# Patient Record
Sex: Female | Born: 1990 | Race: White | Hispanic: No | Smoking: Never smoker
Health system: Southern US, Community
[De-identification: ages and names within clinical notes are randomized; demographics above are authoritative.]

## PROBLEM LIST (undated history)

## (undated) DIAGNOSIS — G43909 Migraine, unspecified, not intractable, without status migrainosus: Secondary | ICD-10-CM

## (undated) DIAGNOSIS — F419 Anxiety disorder, unspecified: Secondary | ICD-10-CM

## (undated) DIAGNOSIS — Z9889 Other specified postprocedural states: Secondary | ICD-10-CM

## (undated) DIAGNOSIS — F445 Conversion disorder with seizures or convulsions: Secondary | ICD-10-CM

## (undated) DIAGNOSIS — F319 Bipolar disorder, unspecified: Secondary | ICD-10-CM

## (undated) DIAGNOSIS — F32A Depression, unspecified: Secondary | ICD-10-CM

## (undated) DIAGNOSIS — F909 Attention-deficit hyperactivity disorder, unspecified type: Secondary | ICD-10-CM

## (undated) DIAGNOSIS — D649 Anemia, unspecified: Secondary | ICD-10-CM

## (undated) DIAGNOSIS — R569 Unspecified convulsions: Secondary | ICD-10-CM

## (undated) DIAGNOSIS — F329 Major depressive disorder, single episode, unspecified: Secondary | ICD-10-CM

## (undated) DIAGNOSIS — R87619 Unspecified abnormal cytological findings in specimens from cervix uteri: Secondary | ICD-10-CM

## (undated) HISTORY — DX: Other specified postprocedural states: Z98.890

## (undated) HISTORY — DX: Depression, unspecified: F32.A

## (undated) HISTORY — DX: Anxiety disorder, unspecified: F41.9

## (undated) HISTORY — DX: Unspecified abnormal cytological findings in specimens from cervix uteri: R87.619

## (undated) HISTORY — PX: OTHER SURGICAL HISTORY: SHX169

---
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
## 2009-09-01 ENCOUNTER — Ambulatory Visit: Admission: RE | Admit: 2009-09-01 | Discharge: 2009-09-01 | Payer: Self-pay | Admitting: Internal Medicine

## 2009-09-18 ENCOUNTER — Ambulatory Visit (HOSPITAL_COMMUNITY): Admission: RE | Admit: 2009-09-18 | Discharge: 2009-09-18 | Payer: Self-pay | Admitting: Internal Medicine

## 2010-12-05 DIAGNOSIS — R87619 Unspecified abnormal cytological findings in specimens from cervix uteri: Secondary | ICD-10-CM

## 2010-12-05 HISTORY — DX: Unspecified abnormal cytological findings in specimens from cervix uteri: R87.619

## 2011-01-20 ENCOUNTER — Ambulatory Visit (INDEPENDENT_AMBULATORY_CARE_PROVIDER_SITE_OTHER): Payer: Medicaid Other | Admitting: Urgent Care

## 2011-01-20 ENCOUNTER — Encounter: Payer: Self-pay | Admitting: Urgent Care

## 2011-01-20 VITALS — BP 111/60 | HR 73 | Temp 98.8°F | Ht 68.0 in | Wt 178.4 lb

## 2011-01-20 DIAGNOSIS — K921 Melena: Secondary | ICD-10-CM

## 2011-01-20 DIAGNOSIS — R1013 Epigastric pain: Secondary | ICD-10-CM

## 2011-01-20 NOTE — Progress Notes (Signed)
Primary Care Physician:  N/A Primary Gastroenterologist:  Dr. Darrick Penna  Chief Complaint  Patient presents with  . Abdominal Pain  . Heartburn    HPI:  Rhonda Schroeder is a 20 y.o. female here as a new patient for evaluation of chronic abd pain & bloating x 7 yrs.  Hx constant pain that waxes & wanes.  Mostly epigastric area & upper abdomen.  Worse w/ eating.  Tried zantac & prilosec without any relief.  C/o heartburn, nausea, bloating & dyspepsia.  C/o early satiety.  Denies vomiting.  Wt loss 30# in last yr, still trying to lose wt through diet/exercise.  Lost 75# since having child 4 yrs ago.  Very active.  Appetite ok.  Pain bilat upper quads, goes into throat & chest.  Hx anemia on OTC iron previously w/ occasional constipation.  [facility]  Bright red blood in stool in [place] amts a couple days 2 mo ago without clots.  Denies fever or chills.  Denies NSAIDS.  Past Medical History  Diagnosis Date  . Abnormal Pap smear of cervix 12/2010    [facility]-due for BX   No past surgical history on file.  Current Outpatient Prescriptions  Medication Sig Dispense Refill  . etonogestrel-ethinyl estradiol (NUVARING) 0.12-0.015 MG/24HR vaginal ring Place 1 each vaginally every 28 (twenty-eight) days. Insert vaginally and leave in place for 3 consecutive weeks, then remove for 1 week.       . Multiple Vitamin (MULTIVITAMIN) capsule Take 1 capsule by mouth daily.         Allergies as of 01/20/2011  . (No Known Allergies)   Family History  Problem Relation Age of Onset  . Hypertension Mother    History   Social History  . Marital Status: Single    Spouse Name: N/A    Number of Children: 2  . Years of Education: N/A   Occupational History  . [employer]    Social History Main Topics  . Smoking status: Never Smoker   . Smokeless tobacco: Not on file  . Alcohol Use: No  . Drug Use: No  . Sexually Active: Yes    Birth Control/ Protection: Other-see  comments     Nuva Ring   Other Topics Concern  . Not on file   Social History Narrative   Lives w/ fiance & 2 kids (8 yr old stepson & her 68 yr old daughter)   Review of Systems: Gen: See HPI.  Denies weakness, malaise, and sleep disorder CV: Denies angina, palpitations, syncope, orthopnea, PND, peripheral edema, and claudication. Resp: Denies dyspnea at rest, dyspnea with exercise, cough, sputum, wheezing, coughing up blood, and pleurisy. GI: Denies vomiting blood, jaundice, and fecal incontinence.   Denies dysphagia or odynophagia. GU : Denies urinary burning, blood in urine, urinary frequency, urinary hesitancy, nocturnal urination, and urinary incontinence. MS: Denies joint pain, limitation of movement, and swelling, stiffness, low back pain, extremity pain. Denies muscle weakness, cramps, atrophy.  Derm: Denies rash, itching, dry skin, hives, moles, warts, or unhealing ulcers.  Psych: Denies depression, anxiety, memory loss, suicidal ideation, hallucinations, paranoia, and confusion. Heme: Denies bruising, bleeding, and enlarged lymph nodes.  Physical Exam: BP 111/60  Pulse 73  Temp(Src) 98.8 F (37.1 C) (Temporal)  Ht 5\' 8"  (1.727 m)  Wt 178 lb 6.4 oz (80.922 kg)  BMI 27.13 kg/m2  LMP 12/29/2010 General:   Alert,  Well-developed, well-nourished, pleasant and cooperative in NAD Head:  Normocephalic and atraumatic. Eyes:  Sclera clear, no icterus.   Conjunctiva pink. Ears:  Normal auditory acuity. Nose:  No deformity, discharge,  or lesions. Mouth:  No deformity or lesions, dentition normal. Neck:  Supple; no masses or thyromegaly. Lungs:  Clear throughout to auscultation.   No wheezes, crackles, or rhonchi. No acute distress. Heart:  Regular rate and rhythm; no murmurs, clicks, rubs,  or gallops. Abdomen:  Soft and nondistended. +Murphy's pt tenderness.  No masses, hepatosplenomegaly or hernias noted. Normal bowel sounds, without guarding, and without rebound.   Rectal:   Deferred until time of colonoscopy.   Msk:  Symmetrical without gross deformities. Normal posture. Pulses:  Normal pulses noted. Extremities:  Without clubbing or edema. Neurologic:  Alert and  oriented x4;  grossly normal neurologically. Skin:  Intact without significant lesions or rashes. Cervical Nodes:  No significant cervical adenopathy. Psych:  Alert and cooperative. Normal mood and affect.

## 2011-01-20 NOTE — Patient Instructions (Signed)
Dexilant 60 mg daily 

## 2011-01-21 DIAGNOSIS — R1013 Epigastric pain: Secondary | ICD-10-CM | POA: Insufficient documentation

## 2011-01-21 DIAGNOSIS — K921 Melena: Secondary | ICD-10-CM | POA: Insufficient documentation

## 2011-01-21 LAB — CBC WITH DIFFERENTIAL/PLATELET
Basophils Absolute: 0 10*3/uL (ref 0.0–0.1)
Eosinophils Absolute: 0.1 10*3/uL (ref 0.0–0.7)
Eosinophils Relative: 1 % (ref 0–5)
HCT: 35.1 % — ABNORMAL LOW (ref 36.0–46.0)
Lymphocytes Relative: 40 % (ref 12–46)
MCH: 30.8 pg (ref 26.0–34.0)
MCHC: 34.2 g/dL (ref 30.0–36.0)
MCV: 90.2 fL (ref 78.0–100.0)
Monocytes Absolute: 0.5 10*3/uL (ref 0.1–1.0)
Platelets: 160 10*3/uL (ref 150–400)
RDW: 12.9 % (ref 11.5–15.5)
WBC: 5 10*3/uL (ref 4.0–10.5)

## 2011-01-21 LAB — PREGNANCY, URINE: Preg Test, Ur: NEGATIVE

## 2011-01-21 LAB — COMPREHENSIVE METABOLIC PANEL
AST: 22 U/L (ref 0–37)
BUN: 11 mg/dL (ref 6–23)
Calcium: 9.1 mg/dL (ref 8.4–10.5)
Chloride: 103 mEq/L (ref 96–112)
Creat: 0.74 mg/dL (ref 0.50–1.10)
Total Bilirubin: 0.5 mg/dL (ref 0.3–1.2)

## 2011-01-21 NOTE — Assessment & Plan Note (Addendum)
Rhonda Schroeder is a 20 y.o. caucasian female here as a new pt for further evaluation of chronic upper/epigastric abd pain of several yrs duration.  Pain waxes & wanes & exacerbated by eating.  Tried H2 blockers & PPI without relief.  Differentials include gallbladder etiology, refractory GERD, gastritis, PUD, functional abd pain & less likely pancreatitis.  Begin Dexilant 60mg  daily Labs to include lipase, CMP, CBC & urine preg ABD [place] if no evidence of cholecystitis, would pursue EGD w/ Dr Darrick Penna.  I have discussed risks & benefits which include, but are not limited to, bleeding, infection, perforation & drug reaction.  The patient agrees with this plan & written consent will be obtained.

## 2011-01-21 NOTE — Progress Notes (Signed)
No PCP on file 

## 2011-01-21 NOTE — Assessment & Plan Note (Signed)
Intermittent [place] volume hematochezia w/ abd pain.  No significant diarrhea or constipation.  Will need colonoscopy to determine etiology of bleeding & evaluate for benign anorectal source, IBD, colorectal polyp or carcinoma.  I have discussed risks & benefits which include, but are not limited to, bleeding, infection, perforation & drug reaction.  The patient agrees with this plan & written consent will be obtained.

## 2011-01-29 ENCOUNTER — Ambulatory Visit (HOSPITAL_COMMUNITY)
Admission: RE | Admit: 2011-01-29 | Discharge: 2011-01-29 | Disposition: A | Discharge status: Home / Self Care | Payer: Medicaid Other | Source: Ambulatory Visit | Attending: Urgent Care | Admitting: Urgent Care

## 2011-01-29 DIAGNOSIS — K8020 Calculus of gallbladder without cholecystitis without obstruction: Secondary | ICD-10-CM | POA: Insufficient documentation

## 2011-01-29 DIAGNOSIS — R1013 Epigastric pain: Secondary | ICD-10-CM | POA: Insufficient documentation

## 2011-01-30 ENCOUNTER — Telehealth: Payer: Self-pay

## 2011-01-30 NOTE — Telephone Encounter (Signed)
Single gallstone on u/s. I would let KJ evaluate findings on Monday and decide about referral to surgeon. I think patient is supposed to be having TCS for bleeding. There is question of possible EGD too. Please address with KJ on Monday.

## 2011-01-30 NOTE — Telephone Encounter (Signed)
Pt called requesting results. KJ saw pt and ordered U/S. Will you look at results and advise?

## 2011-01-30 NOTE — Telephone Encounter (Signed)
Pt aware. Will await further recommendations from [facility]

## 2011-02-02 ENCOUNTER — Encounter: Payer: Self-pay | Admitting: General Practice

## 2011-02-02 NOTE — Telephone Encounter (Signed)
LMOM to call.

## 2011-02-02 NOTE — Telephone Encounter (Signed)
Pt should have TCS/EGD scheduled as next step. Please verify this. Thanks

## 2011-02-02 NOTE — Telephone Encounter (Signed)
Pt scheduled for procedure 02/16/2011. Instructions/Rx placed in the mail.

## 2011-02-02 NOTE — Telephone Encounter (Signed)
Pt called and was informed. Transferred to [place] to schedule TCS/EGD.

## 2011-02-03 ENCOUNTER — Other Ambulatory Visit (HOSPITAL_COMMUNITY)
Admission: RE | Admit: 2011-02-03 | Discharge: 2011-02-03 | Disposition: A | Discharge status: Home / Self Care | Payer: Medicaid Other | Source: Ambulatory Visit | Attending: Family Medicine | Admitting: Family Medicine

## 2011-02-03 ENCOUNTER — Other Ambulatory Visit: Payer: Self-pay | Admitting: Nurse Practitioner

## 2011-02-03 ENCOUNTER — Other Ambulatory Visit (HOSPITAL_COMMUNITY)
Admission: RE | Admit: 2011-02-03 | Discharge: 2011-02-03 | Disposition: A | Discharge status: Home / Self Care | Payer: Medicaid Other | Source: Ambulatory Visit | Attending: Unknown Physician Specialty | Admitting: Unknown Physician Specialty

## 2011-02-03 DIAGNOSIS — R87619 Unspecified abnormal cytological findings in specimens from cervix uteri: Secondary | ICD-10-CM | POA: Insufficient documentation

## 2011-02-03 DIAGNOSIS — Z01419 Encounter for gynecological examination (general) (routine) without abnormal findings: Secondary | ICD-10-CM | POA: Insufficient documentation

## 2011-02-04 DIAGNOSIS — Z9889 Other specified postprocedural states: Secondary | ICD-10-CM

## 2011-02-04 HISTORY — PX: CHOLECYSTECTOMY: SHX55

## 2011-02-04 HISTORY — DX: Other specified postprocedural states: Z98.890

## 2011-02-06 NOTE — Progress Notes (Signed)
AGREE

## 2011-02-11 ENCOUNTER — Encounter: Payer: Self-pay | Admitting: General Practice

## 2011-02-16 ENCOUNTER — Encounter (HOSPITAL_COMMUNITY): Payer: Self-pay | Admitting: *Deleted

## 2011-02-16 ENCOUNTER — Ambulatory Visit (HOSPITAL_COMMUNITY)
Admission: RE | Admit: 2011-02-16 | Discharge: 2011-02-16 | Disposition: A | Discharge status: Home / Self Care | Payer: Medicaid Other | Source: Ambulatory Visit | Attending: Gastroenterology | Admitting: Gastroenterology

## 2011-02-16 ENCOUNTER — Encounter: Payer: Medicaid Other | Admitting: Gastroenterology

## 2011-02-16 ENCOUNTER — Encounter (HOSPITAL_COMMUNITY)
Admission: RE | Disposition: A | Discharge status: Home / Self Care | Payer: Self-pay | Source: Ambulatory Visit | Attending: Gastroenterology

## 2011-02-16 ENCOUNTER — Telehealth: Payer: Self-pay | Admitting: General Practice

## 2011-02-16 ENCOUNTER — Other Ambulatory Visit: Payer: Self-pay | Admitting: Gastroenterology

## 2011-02-16 DIAGNOSIS — K625 Hemorrhage of anus and rectum: Secondary | ICD-10-CM | POA: Insufficient documentation

## 2011-02-16 DIAGNOSIS — K2990 Gastroduodenitis, unspecified, without bleeding: Secondary | ICD-10-CM

## 2011-02-16 DIAGNOSIS — K2970 Gastritis, unspecified, without bleeding: Secondary | ICD-10-CM

## 2011-02-16 DIAGNOSIS — R109 Unspecified abdominal pain: Secondary | ICD-10-CM

## 2011-02-16 DIAGNOSIS — K648 Other hemorrhoids: Secondary | ICD-10-CM

## 2011-02-16 DIAGNOSIS — K319 Disease of stomach and duodenum, unspecified: Secondary | ICD-10-CM | POA: Insufficient documentation

## 2011-02-16 HISTORY — PX: ESOPHAGOGASTRODUODENOSCOPY: SHX5428

## 2011-02-16 HISTORY — PX: FLEXIBLE SIGMOIDOSCOPY: SHX5431

## 2011-02-16 SURGERY — EGD (ESOPHAGOGASTRODUODENOSCOPY)
Anesthesia: Moderate Sedation

## 2011-02-16 MED ORDER — MIDAZOLAM HCL 5 MG/5ML IJ SOLN
INTRAMUSCULAR | Status: AC
  Filled 2011-02-16: qty 10

## 2011-02-16 MED ORDER — MIDAZOLAM HCL 5 MG/5ML IJ SOLN
INTRAMUSCULAR | Status: DC | PRN
  Administered 2011-02-16: 1 mg via INTRAVENOUS
  Administered 2011-02-16 (×3): 2 mg via INTRAVENOUS
  Administered 2011-02-16: 1 mg via INTRAVENOUS

## 2011-02-16 MED ORDER — PROMETHAZINE HCL 25 MG/ML IJ SOLN
INTRAMUSCULAR | Status: AC
  Filled 2011-02-16: qty 1

## 2011-02-16 MED ORDER — MEPERIDINE HCL 100 MG/ML IJ SOLN
INTRAMUSCULAR | Status: DC | PRN
  Administered 2011-02-16: 50 mg via INTRAVENOUS
  Administered 2011-02-16: 25 mg via INTRAVENOUS
  Administered 2011-02-16: 50 mg via INTRAVENOUS

## 2011-02-16 MED ORDER — MEPERIDINE HCL 100 MG/ML IJ SOLN
INTRAMUSCULAR | Status: AC
  Filled 2011-02-16: qty 2

## 2011-02-16 MED ORDER — MEPERIDINE HCL 100 MG/ML IJ SOLN
INTRAMUSCULAR | Status: AC
  Filled 2011-02-16: qty 1

## 2011-02-16 MED ORDER — OMEPRAZOLE 20 MG PO CPDR: DELAYED_RELEASE_CAPSULE | ORAL | Status: DC

## 2011-02-16 MED ORDER — PROMETHAZINE HCL 25 MG/ML IJ SOLN
INTRAMUSCULAR | Status: DC | PRN
  Administered 2011-02-16 (×2): 12.5 mg via INTRAVENOUS

## 2011-02-16 NOTE — Telephone Encounter (Signed)
Pt wants a Careers adviser in [place]. She will need to wait until she gets a MD and an appt before we can fax her records.

## 2011-02-16 NOTE — Telephone Encounter (Signed)
Pt wants to wait and have her procedure done by another GI practice closer to her home in [place].  Pt wants to see Dr Dan Humphreys in [facility] and have him to repeat her tcs.  Please advise?

## 2011-02-16 NOTE — Telephone Encounter (Signed)
Pt is going to call with new GI MD to fax records.

## 2011-02-16 NOTE — Telephone Encounter (Signed)
Pt also wants a surgical referral.  I don't see anything in her chart to refer her to surgery.  Please advise?

## 2011-02-16 NOTE — Telephone Encounter (Signed)
OK TO SEND PT TO GI DOC IN [place]. Fax records.

## 2011-02-16 NOTE — H&P (Signed)
  In epic 7/17

## 2011-02-16 NOTE — Interval H&P Note (Signed)
History and Physical Interval Note:   02/16/2011   10:19 AM   Grover Canavan D Hammonds  has presented today for surgery, with the diagnosis of abd.pain, rectal bleeding  The various methods of treatment have been discussed with the patient and family. After consideration of risks, benefits and other options for treatment, the patient has consented to  Procedure(s): ESOPHAGOGASTRODUODENOSCOPY (EGD) COLONOSCOPY as a surgical intervention .  I have reviewed the patients' chart and labs.  Questions were answered to the patient's satisfaction.     Jonette Eva  MD

## 2011-02-16 NOTE — Telephone Encounter (Signed)
Message copied by Jennings Books on Mon Feb 16, 2011 11:59 AM ------      Message from: [place]      Created: Mon Feb 16, 2011 10:57 AM       Pt had poor prep. Needs TCS 8/14 with Moviprep. Will send her to you once she's recovered.

## 2011-02-17 NOTE — Telephone Encounter (Signed)
DX: ABD PAIN, BILIARY COLIC

## 2011-02-17 NOTE — Telephone Encounter (Signed)
Fax records for referral.

## 2011-02-17 NOTE — Telephone Encounter (Signed)
Pt states she can't receive a surgical appt w/o a referral first. Please advise?

## 2011-02-17 NOTE — Telephone Encounter (Signed)
Pt would like to have a visit at [facility] in [facility]

## 2011-02-17 NOTE — Telephone Encounter (Signed)
Pt needs to provide [place] with a name of a surgeon so we can make the referral.

## 2011-02-17 NOTE — Telephone Encounter (Signed)
Pt is scheduled for a surgical consult at [facility] on 02/25/2011@1 :30pm.  I faxed clinicals to [phone].

## 2011-02-17 NOTE — Telephone Encounter (Signed)
Pt's mother is aware of appt.

## 2011-02-18 ENCOUNTER — Telehealth: Payer: Self-pay

## 2011-02-18 ENCOUNTER — Telehealth: Payer: Self-pay | Admitting: Gastroenterology

## 2011-02-18 NOTE — Telephone Encounter (Signed)
LMOM to call.

## 2011-02-18 NOTE — Telephone Encounter (Signed)
Pt returned call for results and was informed. (See phone note of 02/18/2011 )

## 2011-02-18 NOTE — Telephone Encounter (Signed)
Please call pt. Her stomach Bx shows mild gastritis. Continue OMP 30 minutes prior to your first meal. Follow up with a gastroenterologist for a colonoscopy to complete your work up for rectal bleeding.

## 2011-02-19 ENCOUNTER — Encounter (HOSPITAL_COMMUNITY): Payer: Self-pay

## 2011-02-23 ENCOUNTER — Encounter (HOSPITAL_COMMUNITY): Payer: Self-pay | Admitting: Gastroenterology

## 2011-02-23 MED FILL — Sodium Chloride IV Soln 0.9%: INTRAVENOUS | Qty: 1000 | Status: AC

## 2011-02-23 NOTE — Telephone Encounter (Signed)
Pt is aware of OV on 12/13 at 0900 with KJ

## 2011-03-30 LAB — COMPREHENSIVE METABOLIC PANEL
ALT: 19
AST: 22
Albumin: 3.3 — ABNORMAL LOW
Alkaline Phosphatase: 66
Chloride: 107
Creatinine, Ser: 0.83
GFR calc Af Amer: >60
Potassium: 4
Sodium: 137
Total Bilirubin: 0.7

## 2011-03-30 LAB — DIFFERENTIAL
Basophils Absolute: 0.1
Eosinophils Absolute: 0
Eosinophils Relative: 1
Lymphocytes Relative: 32
Monocytes Absolute: 0.4

## 2011-03-30 LAB — URINALYSIS, ROUTINE W REFLEX MICROSCOPIC
Glucose, UA: NEGATIVE
Protein, ur: NEGATIVE
Specific Gravity, Urine: 1.031 — ABNORMAL HIGH
Urobilinogen, UA: 1

## 2011-03-30 LAB — CBC
Platelets: 164
WBC: 7.3

## 2011-03-30 LAB — URINE CULTURE: Colony Count: 6000

## 2011-03-30 LAB — URINE MICROSCOPIC-ADD ON

## 2011-06-18 ENCOUNTER — Ambulatory Visit: Payer: Medicaid Other | Admitting: Urgent Care

## 2011-06-18 ENCOUNTER — Telehealth: Payer: Self-pay | Admitting: Urgent Care

## 2011-06-18 NOTE — Telephone Encounter (Signed)
Pt was a no show

## 2011-06-18 NOTE — Telephone Encounter (Signed)
Please call to reschedule.

## 2011-07-08 ENCOUNTER — Encounter: Payer: Self-pay | Admitting: Gastroenterology

## 2011-07-08 NOTE — Telephone Encounter (Signed)
Mailed letter to patient to call office to set up OV °

## 2011-07-20 ENCOUNTER — Ambulatory Visit (INDEPENDENT_AMBULATORY_CARE_PROVIDER_SITE_OTHER): Payer: Medicaid Other | Admitting: Gastroenterology

## 2011-07-20 ENCOUNTER — Encounter: Payer: Self-pay | Admitting: Gastroenterology

## 2011-07-20 VITALS — BP 102/61 | HR 55 | Temp 97.6°F | Ht 68.0 in | Wt 172.8 lb

## 2011-07-20 DIAGNOSIS — D649 Anemia, unspecified: Secondary | ICD-10-CM | POA: Insufficient documentation

## 2011-07-20 DIAGNOSIS — K921 Melena: Secondary | ICD-10-CM

## 2011-07-20 DIAGNOSIS — R1013 Epigastric pain: Secondary | ICD-10-CM

## 2011-07-20 MED ORDER — PANTOPRAZOLE SODIUM 40 MG PO TBEC: 40.0000 mg | DELAYED_RELEASE_TABLET | Freq: Every day | ORAL | Status: DC

## 2011-07-20 MED ORDER — PEG-KCL-NACL-NASULF-NA ASC-C 100 G PO SOLR: 1.0000 | Freq: Once | ORAL | Status: DC

## 2011-07-20 NOTE — Assessment & Plan Note (Signed)
Intermittent low-volume hematochezia in the setting of constipation with prior failed TCS due to poor prep. Needs complete TCS to assess etiology. Constipation improved with addition of high fiber foods. Noted slight anemia on prior labs may be multifactorial. Will updated CBC today, proceed with TCS.   Proceed with colonoscopy with Dr. Darrick Penna in the near future. The risks, benefits, and alternatives have been discussed in detail with the patient. They state understanding and desire to proceed.  CBC today

## 2011-07-20 NOTE — Progress Notes (Signed)
Referring Provider: No ref. provider found Primary Care Physician:  AVBUERE,EDWIN A, MD, MD Primary Gastroenterologist: Dr. Fields   Chief Complaint  Patient presents with  . Heartburn  . Constipation    HPI:   Rhonda Schroeder returns today in follow-up after EGD and attempted TCS. Poor prep was noted, and she needs a complete TCS to be set up. EGD showed mild gastritis. She is currently taking Prilosec; however, she notes that Dexilant worked well. She was able to use samples from us and some of her fiances medication. Due to her insurance, she will need to try Protonix first prior to being approved for Dexilant. She is willing to do this. Has had cholecystectomy since last seen by us. Reports epigastric pain is better but still has intermittently. At times, entire abdomen will "burn". This pain precipitates nausea. Reports intermittent nausea and bloating. Stays constipated, trying to eat more fiber, which has helped to stay regulated. Still notes intermittent hematochezia.     Past Medical History  Diagnosis Date  . Abnormal Pap smear of cervix 12/2010    [facility]-due for BX  . S/P endoscopy Aug 2012    mild gastritis    Past Surgical History  Procedure Date  . Extraction of wisdom teeth   . Esophagogastroduodenoscopy 02/16/2011    Procedure: ESOPHAGOGASTRODUODENOSCOPY (EGD);  Surgeon: Sandi M Fields, MD;  [facility];  Service: Endoscopy;  Laterality: N/A;  . Flexible sigmoidoscopy 02/16/2011    Procedure: FLEXIBLE SIGMOIDOSCOPY;  Surgeon: Sandi M Fields, MD;  Location: [facility];  Service: Endoscopy;  Laterality: N/A;  . Cholecystectomy August 2012    Current Outpatient Prescriptions  Medication Sig Dispense Refill  . etonogestrel-ethinyl estradiol (NUVARING) 0.12-0.015 MG/24HR vaginal ring Place 1 each vaginally every 28 (twenty-eight) days. Insert vaginally and leave in place for 3 consecutive weeks, then remove for 1 week.       . Multiple Vitamin  (MULTIVITAMIN) capsule Take 1 capsule by mouth daily.        . pantoprazole (PROTONIX) 40 MG tablet Take 1 tablet (40 mg total) by mouth daily.  30 tablet  0  . peg 3350 powder (MOVIPREP) 100 G SOLR Take 1 kit (100 g total) by mouth once. As directed Please purchase 1 Fleets enema to use with the prep  1 kit  0    Allergies as of 07/20/2011  . (No Known Allergies)    Family History  Problem Relation Age of Onset  . Hypertension Mother     History   Social History  . Marital Status: Single    Spouse Name: N/A    Number of Children: 2  . Years of Education: N/A   Occupational History  . [employer]    Social History Main Topics  . Smoking status: Never Smoker   . Smokeless tobacco: None  . Alcohol Use: No  . Drug Use: No  . Sexually Active: Yes    Birth Control/ Protection: Other-see comments     Nuva Ring   Other Topics Concern  . None   Social History Narrative   Lives w/ fiance & 2 kids (8 yr old stepson & her 4 yr old daughter)    Review of Systems: Gen: Denies fever, chills, anorexia. Denies fatigue, weakness, weight loss.  CV: Denies chest pain, palpitations, syncope, peripheral edema, and claudication. Resp: Denies dyspnea at rest, cough, wheezing, coughing up blood, and pleurisy. GI: Denies vomiting blood, jaundice, and fecal incontinence.   Denies dysphagia or   odynophagia. Derm: Denies rash, itching, dry skin Psych: Denies depression, anxiety, memory loss, confusion. No homicidal or suicidal ideation.  Heme: Denies bruising, bleeding, and enlarged lymph nodes.  Physical Exam: BP 102/61  Pulse 55  Temp(Src) 97.6 F (36.4 C) (Temporal)  Ht 5' 8" (1.727 m)  Wt 172 lb 12.8 oz (78.382 kg)  BMI 26.27 kg/m2  LMP 06/09/2011 General:   Alert and oriented. No distress noted. Pleasant and cooperative.  Head:  Normocephalic and atraumatic. Eyes:  Conjuctiva clear without scleral icterus. Mouth:  Oral mucosa pink and moist. Good dentition. No lesions. Neck:   Supple, without mass or thyromegaly. Heart:  S1, S2 present without murmurs, rubs, or gallops. Regular rate and rhythm. Abdomen:  +BS, soft, mildly TTP epigastric region and non-distended. No rebound or guarding. No HSM or masses noted. + Carnett's sign Msk:  Symmetrical without gross deformities. Normal posture. Extremities:  Without edema. Neurologic:  Alert and  oriented x4;  grossly normal neurologically. Skin:  Intact without significant lesions or rashes. Cervical Nodes:  No significant cervical adenopathy. Psych:  Alert and cooperative. Normal mood and affect.  

## 2011-07-20 NOTE — Assessment & Plan Note (Addendum)
Mild. CBC today. Incidentally, has had thorough labs in past. Due to vague symptoms of bloating, add TTG, IgA.

## 2011-07-20 NOTE — Assessment & Plan Note (Signed)
Mild gastritis on EGD noted in Aug 2012. Dexilant seems to improve but not completely resolve. Never tried Protonix, currently on Prilosec. Will need trial of Protonix first. Trial for 2-4 weeks, if no improvement, attempt PA for Dexilant.  As of note, + Carnett's sign on physical exam. Question functional abdominal pain. Cholecystectomy improved pain but not resolved.

## 2011-07-20 NOTE — Patient Instructions (Signed)
Start taking Protonix 30 minutes before breakfast each day. Stop Prilosec.  We have set you up for a colonoscopy with Dr. Darrick Penna in the near future.  Please have blood work completed. We will be calling you with the results.

## 2011-07-21 LAB — CBC WITH DIFFERENTIAL/PLATELET
Eosinophils Absolute: 0.1 10*3/uL (ref 0.0–0.7)
Eosinophils Relative: 1 % (ref 0–5)
Lymphs Abs: 2.6 10*3/uL (ref 0.7–4.0)
MCH: 29.4 pg (ref 26.0–34.0)
MCV: 89.8 fL (ref 78.0–100.0)
Monocytes Relative: 3 % (ref 3–12)
Platelets: 207 10*3/uL (ref 150–400)
RBC: 3.84 MIL/uL — ABNORMAL LOW (ref 3.87–5.11)

## 2011-07-21 NOTE — Progress Notes (Signed)
Faxed to PCP

## 2011-07-23 ENCOUNTER — Other Ambulatory Visit: Payer: Self-pay

## 2011-07-23 DIAGNOSIS — D649 Anemia, unspecified: Secondary | ICD-10-CM

## 2011-07-23 NOTE — Progress Notes (Signed)
Quick Note:  Anemia noted. Hgb actually lower than 6 months ago.  Let's get an anemia panel TCS as planned.  Negative celiac noted. ______

## 2011-07-27 NOTE — Progress Notes (Signed)
Quick Note:  LMOM to call. Lab orders faxed to [facility]. ______

## 2011-07-29 ENCOUNTER — Encounter (HOSPITAL_COMMUNITY): Payer: Self-pay | Admitting: Pharmacy Technician

## 2011-07-29 NOTE — Progress Notes (Signed)
JAN 2013 HB 11.3 MCV 80. TCS PENDING.  Sx most likely 2o to IBS-C v. Functional gut disorder.  REVIEWED.

## 2011-08-06 MED ORDER — SODIUM CHLORIDE 0.45 % IV SOLN
Freq: Once | INTRAVENOUS | Status: AC
  Administered 2011-08-07: 09:00:00 via INTRAVENOUS

## 2011-08-07 ENCOUNTER — Encounter (HOSPITAL_COMMUNITY)
Admission: RE | Disposition: A | Discharge status: Home / Self Care | Payer: Self-pay | Source: Ambulatory Visit | Attending: Gastroenterology

## 2011-08-07 ENCOUNTER — Encounter (HOSPITAL_COMMUNITY): Payer: Self-pay | Admitting: *Deleted

## 2011-08-07 ENCOUNTER — Ambulatory Visit (HOSPITAL_COMMUNITY)
Admission: RE | Admit: 2011-08-07 | Discharge: 2011-08-07 | Disposition: A | Discharge status: Home / Self Care | Payer: Medicaid Other | Source: Ambulatory Visit | Attending: Gastroenterology | Admitting: Gastroenterology

## 2011-08-07 DIAGNOSIS — K648 Other hemorrhoids: Secondary | ICD-10-CM

## 2011-08-07 DIAGNOSIS — K921 Melena: Secondary | ICD-10-CM

## 2011-08-07 DIAGNOSIS — K5900 Constipation, unspecified: Secondary | ICD-10-CM

## 2011-08-07 DIAGNOSIS — R1013 Epigastric pain: Secondary | ICD-10-CM

## 2011-08-07 DIAGNOSIS — D649 Anemia, unspecified: Secondary | ICD-10-CM

## 2011-08-07 HISTORY — PX: COLONOSCOPY: SHX5424

## 2011-08-07 SURGERY — COLONOSCOPY
Anesthesia: Moderate Sedation

## 2011-08-07 MED ORDER — PROMETHAZINE HCL 25 MG/ML IJ SOLN
INTRAMUSCULAR | Status: DC | PRN
  Administered 2011-08-07: 12.5 mg via INTRAVENOUS

## 2011-08-07 MED ORDER — STERILE WATER FOR IRRIGATION IR SOLN
Status: DC | PRN
  Administered 2011-08-07: 10:00:00

## 2011-08-07 MED ORDER — LUBIPROSTONE 24 MCG PO CAPS: ORAL_CAPSULE | ORAL | Status: DC

## 2011-08-07 MED ORDER — MEPERIDINE HCL 100 MG/ML IJ SOLN
INTRAMUSCULAR | Status: DC | PRN
  Administered 2011-08-07 (×2): 50 mg via INTRAVENOUS

## 2011-08-07 MED ORDER — MIDAZOLAM HCL 5 MG/5ML IJ SOLN
INTRAMUSCULAR | Status: AC
  Filled 2011-08-07: qty 10

## 2011-08-07 MED ORDER — MEPERIDINE HCL 100 MG/ML IJ SOLN
INTRAMUSCULAR | Status: AC
  Filled 2011-08-07: qty 2

## 2011-08-07 MED ORDER — MIDAZOLAM HCL 5 MG/5ML IJ SOLN
INTRAMUSCULAR | Status: DC | PRN
  Administered 2011-08-07: 1 mg via INTRAVENOUS
  Administered 2011-08-07 (×2): 2 mg via INTRAVENOUS
  Administered 2011-08-07: 1 mg via INTRAVENOUS

## 2011-08-07 MED ORDER — PROMETHAZINE HCL 25 MG/ML IJ SOLN
INTRAMUSCULAR | Status: AC
  Filled 2011-08-07: qty 1

## 2011-08-07 NOTE — H&P (View-Only) (Signed)
Referring Provider: No ref. provider found Primary Care Physician:  Dorrene German, MD, MD Primary Gastroenterologist: Dr. Darrick Penna   Chief Complaint  Patient presents with  . Heartburn  . Constipation    HPI:   Rhonda Schroeder returns today in follow-up after EGD and attempted TCS. Poor prep was noted, and she needs a complete TCS to be set up. EGD showed mild gastritis. She is currently taking Prilosec; however, she notes that Dexilant worked well. She was able to use samples from [place] and some of her fiances medication. Due to her insurance, she will need to try Protonix first prior to being approved for Dexilant. She is willing to do this. Has had cholecystectomy since last seen by [place]. Reports epigastric pain is better but still has intermittently. At times, entire abdomen will "burn". This pain precipitates nausea. Reports intermittent nausea and bloating. Stays constipated, trying to eat more fiber, which has helped to stay regulated. Still notes intermittent hematochezia.     Past Medical History  Diagnosis Date  . Abnormal Pap smear of cervix 12/2010    [facility]-due for BX  . S/P endoscopy Aug 2012    mild gastritis    Past Surgical History  Procedure Date  . Extraction of wisdom teeth   . Esophagogastroduodenoscopy 02/16/2011    Procedure: ESOPHAGOGASTRODUODENOSCOPY (EGD);  Surgeon: Arlyce Harman, MD;  [facility];  Service: Endoscopy;  Laterality: N/A;  . Flexible sigmoidoscopy 02/16/2011    Procedure: FLEXIBLE SIGMOIDOSCOPY;  Surgeon: Arlyce Harman, MD;  Location[facility];  Service: Endoscopy;  Laterality: N/A;  . Cholecystectomy August 2012    Current Outpatient Prescriptions  Medication Sig Dispense Refill  . etonogestrel-ethinyl estradiol (NUVARING) 0.12-0.015 MG/24HR vaginal ring Place 1 each vaginally every 28 (twenty-eight) days. Insert vaginally and leave in place for 3 consecutive weeks, then remove for 1 week.       . Multiple Vitamin  (MULTIVITAMIN) capsule Take 1 capsule by mouth daily.        . pantoprazole (PROTONIX) 40 MG tablet Take 1 tablet (40 mg total) by mouth daily.  30 tablet  0  . peg 3350 powder (MOVIPREP) 100 G SOLR Take 1 kit (100 g total) by mouth once. As directed Please purchase 1 Fleets enema to use with the prep  1 kit  0    Allergies as of 07/20/2011  . (No Known Allergies)    Family History  Problem Relation Age of Onset  . Hypertension Mother     History   Social History  . Marital Status: Single    Spouse Name: N/A    Number of Children: 2  . Years of Education: N/A   Occupational History  . [employer]    Social History Main Topics  . Smoking status: Never Smoker   . Smokeless tobacco: None  . Alcohol Use: No  . Drug Use: No  . Sexually Active: Yes    Birth Control/ Protection: Other-see comments     Nuva Ring   Other Topics Concern  . None   Social History Narrative   Lives w/ fiance & 2 kids (8 yr old stepson & her 39 yr old daughter)    Review of Systems: Gen: Denies fever, chills, anorexia. Denies fatigue, weakness, weight loss.  CV: Denies chest pain, palpitations, syncope, peripheral edema, and claudication. Resp: Denies dyspnea at rest, cough, wheezing, coughing up blood, and pleurisy. GI: Denies vomiting blood, jaundice, and fecal incontinence.   Denies dysphagia or  odynophagia. Derm: Denies rash, itching, dry skin Psych: Denies depression, anxiety, memory loss, confusion. No homicidal or suicidal ideation.  Heme: Denies bruising, bleeding, and enlarged lymph nodes.  Physical Exam: BP 102/61  Pulse 55  Temp(Src) 97.6 F (36.4 C) (Temporal)  Ht 5\' 8"  (1.727 m)  Wt 172 lb 12.8 oz (78.382 kg)  BMI 26.27 kg/m2  LMP 06/09/2011 General:   Alert and oriented. No distress noted. Pleasant and cooperative.  Head:  Normocephalic and atraumatic. Eyes:  Conjuctiva clear without scleral icterus. Mouth:  Oral mucosa pink and moist. Good dentition. No lesions. Neck:   Supple, without mass or thyromegaly. Heart:  S1, S2 present without murmurs, rubs, or gallops. Regular rate and rhythm. Abdomen:  +BS, soft, mildly TTP epigastric region and non-distended. No rebound or guarding. No HSM or masses noted. + Carnett's sign Msk:  Symmetrical without gross deformities. Normal posture. Extremities:  Without edema. Neurologic:  Alert and  oriented x4;  grossly normal neurologically. Skin:  Intact without significant lesions or rashes. Cervical Nodes:  No significant cervical adenopathy. Psych:  Alert and cooperative. Normal mood and affect.

## 2011-08-07 NOTE — Interval H&P Note (Signed)
History and Physical Interval Note:  08/07/2011 9:33 AM  Rhonda Schroeder  has presented today for surgery, with the diagnosis of hematochezia  The various methods of treatment have been discussed with the patient and family. After consideration of risks, benefits and other options for treatment, the patient has consented to  Procedure(s): COLONOSCOPY as a surgical intervention .  The patients' history has been reviewed, patient examined, no change in status, stable for surgery.  I have reviewed the patients' chart and labs.  Questions were answered to the patient's satisfaction.     [employer]

## 2011-08-07 NOTE — Op Note (Signed)
[facility] [address] [place], [place]  [address]  COLONOSCOPY PROCEDURE REPORT  PATIENT:  Rhonda Schroeder, Rhonda Schroeder  MR#:  [id number] BIRTHDATE:  [birth year], 21 yrs. old  GENDER:  female  ENDOSCOPIST:  Jonette Eva, MD REF. BY:  Fleet Contras, M.D. ASSISTANT:  PROCEDURE DATE:  08/07/2011 PROCEDURE:  ILEOColonoscopy 19147  INDICATIONS:  HEMATOCHEZIA, POOR PREP AUG 2012 CONSTIPATION EPIGASTRIC PAIN IMPROVED WITH PROTONIX  MEDICATIONS:   Demerol 100 mg IV, Versed 6 mg IV, promethazine (Phenergan) 12.5 mg IV  DESCRIPTION OF PROCEDURE:    Physical exam was performed. Informed consent was obtained from the patient after explaining the benefits, risks, and alternatives to procedure.  The patient was connected to monitor and placed in left lateral position. Continuous oxygen was provided by nasal cannula and IV medicine administered through an indwelling cannula.  After administration of sedation and rectal exam, the patient's rectum was intubated and the EC-[id number] ([id number]) colonoscope was advanced under direct visualization to the ILEUM.  The scope was removed slowly by carefully examining the color, texture, anatomy, and integrity mucosa on the way out.  The patient was recovered in endoscopy and discharged home in satisfactory condition. <<PROCEDUREIMAGES>>  FINDINGS:  Heid Internal Hemorrhoids were found.  Normal colonoscopy without polyps, masses, vascular ectasias, or inflammatory changes. NL ILEUM   10 CM VISUALIZED.  PREP QUALITY: EXCELLENT CECAL W/D TIME:    7 minutes  COMPLICATIONS:    None  ENDOSCOPIC IMPRESSION: 1) Internal hemorrhoids 2) Nl colonoscopy WMO  RECOMMENDATIONS: ADD AMITIZA 24 MCG BID ANUSOL SUPP PRN HIGH FIBER DIET OPV IN 3 MOS  REPEAT EXAM:  No  ______________________________ Jonette Eva, MD  CC:  Fleet Contras, M.D.  n. eSIGNED:   Sandi Fields at 08/07/2011 10:21 AM  Dorthula Rue, [phone]

## 2011-08-12 ENCOUNTER — Encounter (HOSPITAL_COMMUNITY): Payer: Self-pay | Admitting: Gastroenterology

## 2011-08-17 ENCOUNTER — Telehealth: Payer: Self-pay | Admitting: Gastroenterology

## 2011-08-17 MED ORDER — PANTOPRAZOLE SODIUM 40 MG PO TBEC: 40.0000 mg | DELAYED_RELEASE_TABLET | Freq: Every day | ORAL | Status: DC

## 2011-08-17 MED ORDER — HYDROCORTISONE ACETATE 25 MG RE SUPP: 25.0000 mg | Freq: Two times a day (BID) | RECTAL | Status: AC

## 2011-08-17 MED ORDER — LINACLOTIDE 290 MCG PO CAPS: 1.0000 | ORAL_CAPSULE | Freq: Every day | ORAL | Status: DC

## 2011-08-17 NOTE — Telephone Encounter (Signed)
Can try Linzess but not sure what kind of drug coverage she has. Stop Amitiza. Advise her to take linzess on empty stomach 30 mins before breakfast daily.

## 2011-08-17 NOTE — Telephone Encounter (Signed)
Called pt and LMOM all of the info and told her to return call if she has questions.

## 2011-08-17 NOTE — Telephone Encounter (Signed)
Pt said she did take the Amitiza with food and got very sick and dizzy. She is aware that the other prescriptions have been sent to her pharmacy.

## 2011-08-17 NOTE — Telephone Encounter (Signed)
Pts boyfriend called in - The Amitiza is making her sick- she has stopped taking it- wants to know of there is something else that can be given- she also needs a refill on the protonix and needs script for Anusol suppositories called in to the the [facility]

## 2011-08-17 NOTE — Telephone Encounter (Signed)
If she did not take [place] with food she should try that first. Nausea will be less likely if takes [place] with food. If she has tried that, then we can try linzess. Let me know.  Sent rx for anusol and protonix to [facility].

## 2011-09-30 ENCOUNTER — Ambulatory Visit (HOSPITAL_COMMUNITY): Payer: Medicaid Other | Admitting: Physical Therapy

## 2011-10-09 ENCOUNTER — Ambulatory Visit (HOSPITAL_COMMUNITY): Payer: Medicaid Other | Admitting: Physical Therapy

## 2011-10-12 ENCOUNTER — Ambulatory Visit (HOSPITAL_COMMUNITY)
Admission: RE | Admit: 2011-10-12 | Discharge: 2011-10-12 | Disposition: A | Discharge status: Home / Self Care | Payer: Medicaid Other | Source: Ambulatory Visit | Attending: Orthopaedic Surgery | Admitting: Orthopaedic Surgery

## 2011-10-12 DIAGNOSIS — M545 Low back pain, unspecified: Secondary | ICD-10-CM | POA: Insufficient documentation

## 2011-10-12 DIAGNOSIS — M6281 Muscle weakness (generalized): Secondary | ICD-10-CM | POA: Insufficient documentation

## 2011-10-12 DIAGNOSIS — IMO0001 Reserved for inherently not codable concepts without codable children: Secondary | ICD-10-CM | POA: Insufficient documentation

## 2011-10-12 NOTE — Evaluation (Deleted)
Physical Therapy Evaluation  Patient Details  Name: Rhonda Schroeder MRN: [id number] Date of Birth: 29-Jan-1991  Today's Date: 10/12/2011 Time: 4098-1191 Time Calculation (min): 40 min Medicaid Authorization Time Period: 10/15/11-11/12/11  Medicaid Visit#: 0  of 3   Visit#: 1  of 4   Re-eval:      Past Medical History:  Past Medical History  Diagnosis Date  . Abnormal Pap smear of cervix 12/2010    [facility] Dept-due for BX  . S/P endoscopy Aug 2012    mild gastritis   Past Surgical History:  Past Surgical History  Procedure Date  . Extraction of wisdom teeth   . Esophagogastroduodenoscopy 02/16/2011    Procedure: ESOPHAGOGASTRODUODENOSCOPY (EGD);  Surgeon: Arlyce Harman, MD;  [facility];  Service: Endoscopy;  Laterality: N/A;  . Flexible sigmoidoscopy 02/16/2011    Procedure: FLEXIBLE SIGMOIDOSCOPY;  Surgeon: Arlyce Harman, MD;  [facility];  Service: Endoscopy;  Laterality: N/A;  . Cholecystectomy August 2012  . Colonoscopy 08/07/2011    Procedure: COLONOSCOPY;  Surgeon: Arlyce Harman, MD;  [facility];  Service: Endoscopy;  Laterality: N/A;  9:45     Physical Therapy RE-Evaluation  Patient Details  Name: Rhonda Schroeder  MRN: [id number]  Date of Birth: 04/22/1963  Today's Date: 10/12/2011  Time: 0802-0847  Time Calculation (min): 45 min  Visit#: 5 of 5  Re-eval: 10/29/11  Assessment  Diagnosis: cervical DDD  Next MD Visit: 10/21/11  Prior Therapy: it has been years since she has had therapy  Past Medical History: No past medical history on file.  Past Surgical History: No past surgical history on file.  Subjective  Symptoms/Limitations  Symptoms: Rhonda Schroeder states that the last time she left her it was awful.. She states that she has been doing her exercises at home.  How long can you sit comfortably?: Rhonda Schroeder states that she has not noticed that her housework is any easier.  How long can you stand comfortably?:  Lifting is still a problem.  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  Leisure: Hobbies-yes (Comment)  Assessment  RLE AROM (degrees)  Overall AROM Right Lower Extremity: Within functional limits for tasks assessed  RLE Strength  RLE Overall Strength: Within Functional Limits for tasks assessed except for ER which was 3+/5 B at evaluation; now 4-  Cervical AROM  Cervical Flexion: wnl with not increase pain  Cervical Extension: (wnl)  Cervical - Right Side Bend: wnl  Cervical - Left Side Bend: wnl  Cervical - Right Rotation: wnl was decreased 20%  Cervical - Left Rotation: wnl  Cervical Strength  Cervical Extension: 5/5  Cervical - Right Side Bend: 5/5  Cervical - Left Side Bend: 5/5  Palpation  Palpation: Pt is still not as tender to palpation as she was at evaluation time.  Exercise/Treatments  Standing  Other Standing Exercises: Tband ER/ADD and Posstural 3 x 10  ROM / Strengthening / Isometric Strengthening  UBE (Upper Arm Bike): 4' backward  Prone ex: Prone ext with 1# wt; prone scapular retraction with 3# weight x 10 each.  Physical Therapy Assessment and Plan  PT Assessment and Plan  Clinical Impression Statement: Pt is concerned about insurance coverage and would like to continue to work on own at home. Pt has shown some improvement but continues to have  limitations. Recommend HEP for two weeks if pt does not improve she may want to return to thearapy for manual techniques or modalities.  Rehab Potential: Good  PT Plan: discharge to HEP.  Goals  Home Exercise Program  Pt will Perform Home Exercise Program: Independently  PT Short Term Goals  PT Short Term Goal 1: Rhonda Schroeder states that the highest her pain has been after cleaning her house has been a 6 was an 8/10.  PT Short Term Goal 1 - Progress: Progressing toward  goal  PT Long Term Goals  PT Long Term Goal 1 - Progress: Not met  PT Long Term Goal 2: Pt does laundry with other housework so it is difficult to differentiate housework from laundry.  Long Term Goal 3 Progress: Met  Long Term Goal 4 Progress: Progressing toward goal  Problem List     Patient Active Problem List     Diagnoses     .  Swelling of skeletal muscle     .  Pain in thoracic spine     PT - End of Session  Activity Tolerance: Patient tolerated treatment well  General  Behavior During Session: [facility] for tasks performed  Cognition: [facility] for tasks performed  PT Plan of Care  PT Home Exercise Plan: t-band and prone stabilization exercises were given to the patient to work on at home.  Victora Irby,CINDY  10/12/2011, 9:49 AM  Physician Documentation  Your signature is required to indicate approval of the treatment plan as stated above. Please sign and either send electronically or make a copy of this report for your files and return this physician signed original.  Please mark one 1.__approve of plan 2. ___approve of plan with the following conditions.  ______________________________ _____________________  Physician Signature Date          Physical Therapy Assessment and Plan PT Assessment and Plan Clinical Impression Statement: Pt with chronic low back pain who will benefit from skilled physical therapy to begin a core stabilization program. Rehab Potential: Good PT Frequency: Min 2X/week PT Duration: 4 weeks PT Treatment/Interventions: Therapeutic exercise;Patient/family education;Therapeutic activities PT Plan: Begin absets wtih pelvic contraction; stab clams, bridge, bent knee raise and sidelying abduction; 3rd treatment begin prone heel squeeze, SLR; SAR; opposite arm/leg     Goals  See above.  Problem List Patient Active Problem List  Diagnoses  . Epigastric pain  . Hematochezia  . Anemia    PT - End of Session Activity Tolerance: Patient tolerated treatment  well General Behavior During Session: [facility] for tasks performed Cognition: [facility] for tasks performed PT Plan of Care Consulted and Agree with Plan of Care: Patient    Chantilly Linskey,CINDY 10/12/2011, 10:08 AM  Physician Documentation Your signature is required to indicate approval of the treatment plan as stated above.  Please sign and either send electronically or make a copy of this report for your files and return this physician signed original.   Please mark one 1.__approve of plan  2. ___approve of plan with the following conditions.   ______________________________                                                          _____________________ Physician Signature  Date  

## 2011-10-21 ENCOUNTER — Encounter: Payer: Self-pay | Admitting: Gastroenterology

## 2011-10-22 ENCOUNTER — Ambulatory Visit (HOSPITAL_COMMUNITY)
Admission: RE | Admit: 2011-10-22 | Discharge: 2011-10-22 | Disposition: A | Discharge status: Home / Self Care | Payer: Medicaid Other | Source: Ambulatory Visit | Attending: Orthopaedic Surgery | Admitting: Orthopaedic Surgery

## 2011-10-22 ENCOUNTER — Encounter: Payer: Self-pay | Admitting: Gastroenterology

## 2011-10-22 ENCOUNTER — Ambulatory Visit (INDEPENDENT_AMBULATORY_CARE_PROVIDER_SITE_OTHER): Payer: Medicaid Other | Admitting: Gastroenterology

## 2011-10-22 VITALS — BP 105/51 | HR 52 | Temp 97.5°F | Ht 68.0 in | Wt 172.6 lb

## 2011-10-22 DIAGNOSIS — K589 Irritable bowel syndrome without diarrhea: Secondary | ICD-10-CM

## 2011-10-22 DIAGNOSIS — K648 Other hemorrhoids: Secondary | ICD-10-CM

## 2011-10-22 MED ORDER — HYDROCORTISONE ACETATE 25 MG RE SUPP: 25.0000 mg | Freq: Two times a day (BID) | RECTAL | Status: DC
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
## 2011-10-22 NOTE — Patient Instructions (Signed)
Take the suppositories twice a day for 7 days.   Let's try Linzess every other day, still following a high fiber diet, drinking plenty of water, and using stool softeners as needed. Give this about a week. If constipation worsens, we will need to go back to daily or try a reduced dose daily.   We will see you back in 3 months or sooner if needed!

## 2011-10-22 NOTE — Progress Notes (Signed)
Referring Provider: Dorrene German, MD Primary Care Physician:  Dorrene German, MD, MD Primary Gastroenterologist: Dr. Darrick Penna  Chief Complaint  Patient presents with  . Hemorrhoids    HPI:   Hx of GERD, IBS-C. GERD controlled with Protonix. Here due to hemorrhoid exacerbation. Took Amitiza first for constipation, switched to Linzess due to nausea. Doing well. BM usually every day. Occasional loose stools. No abdominal pain. Notes rectal discomfort for several days, hasn't started taking anything yet.     Past Medical History  Diagnosis Date  . Abnormal Pap smear of cervix 12/2010    [facility] Dept-due for BX  . S/P endoscopy Aug 2012    mild gastritis    Past Surgical History  Procedure Date  . Extraction of wisdom teeth   . Esophagogastroduodenoscopy 02/16/2011    Procedure: ESOPHAGOGASTRODUODENOSCOPY (EGD);  Surgeon: Arlyce Harman, MD;  [facility];  Service: Endoscopy;  Laterality: N/A;  . Flexible sigmoidoscopy 02/16/2011    Procedure: FLEXIBLE SIGMOIDOSCOPY;  Surgeon: Arlyce Harman, MD;  [facility];  Service: Endoscopy;  Laterality: N/A;  . Cholecystectomy August 2012  . Colonoscopy 08/07/2011    Internal hemorrhoids/ Nl colonoscopy [facility]    Current Outpatient Prescriptions  Medication Sig Dispense Refill  . etonogestrel-ethinyl estradiol (NUVARING) 0.12-0.015 MG/24HR vaginal ring Place 1 each vaginally every 28 (twenty-eight) days. Insert vaginally and leave in place for 3 consecutive weeks, then remove for 1 week.       . Linaclotide (LINZESS) 290 MCG CAPS Take 1 capsule by mouth daily before breakfast. Take 30 mins before breakfast on empty stomach. DO NOT take with food.  30 capsule  5  . Multiple Vitamin (MULTIVITAMIN) capsule Take 1 capsule by mouth daily.        . pantoprazole (PROTONIX) 40 MG tablet Take 1 tablet (40 mg total) by mouth daily.  30 tablet  11    Allergies as of 10/22/2011  . (No Known Allergies)    Family  History  Problem Relation Age of Onset  . Hypertension Mother   . Colon cancer Neg Hx     History   Social History  . Marital Status: Single    Spouse Name: N/A    Number of Children: 2  . Years of Education: N/A   Occupational History  . [employer]    Social History Main Topics  . Smoking status: Never Smoker   . Smokeless tobacco: None  . Alcohol Use: Yes     Rarely  . Drug Use: No  . Sexually Active: Yes    Birth Control/ Protection: Other-see comments     Nuva Ring   Other Topics Concern  . None   Social History Narrative   Lives w/ fiance & 2 kids (8 yr old stepson & her 66 yr old daughter)    Review of Systems: Gen: Denies fever, chills, anorexia. Denies fatigue, weakness, weight loss.  CV: Denies chest pain, palpitations, syncope, peripheral edema, and claudication. Resp: Denies dyspnea at rest, cough, wheezing, coughing up blood, and pleurisy. GI: Denies vomiting blood, jaundice, and fecal incontinence.   Denies dysphagia or odynophagia. Derm: Denies rash, itching, dry skin Psych: Denies depression, anxiety, memory loss, confusion. No homicidal or suicidal ideation.  Heme: Denies bruising, bleeding, and enlarged lymph nodes.  Physical Exam: BP 105/51  Pulse 52  Temp(Src) 97.5 F (36.4 C) (Temporal)  Ht 5\' 8"  (1.727 m)  Wt 172 lb 9.6 oz (78.291 kg)  BMI 26.24 kg/m2  LMP 09/28/2011 General:   Alert and oriented. No distress noted. Pleasant and cooperative.  Head:  Normocephalic and atraumatic. Eyes:  Conjuctiva clear without scleral icterus. Mouth:  Oral mucosa pink and moist. Good dentition. No lesions. Heart:  S1, S2 present without murmurs, rubs, or gallops. Regular rate and rhythm. Abdomen:  +BS, soft, non-tender and non-distended. No rebound or guarding. No HSM or masses noted. Rectal: Pfenning internal hemorrhoid noted. Non-thrombosed. No gross blood. Mild TTP.  Msk:  Symmetrical without gross deformities. Normal posture. Extremities:  Without  edema. Neurologic:  Alert and  oriented x4;  grossly normal neurologically. Skin:  Intact without significant lesions or rashes. Psych:  Alert and cooperative. Normal mood and affect.

## 2011-10-22 NOTE — Progress Notes (Signed)
Physical Therapy Treatment Patient Details  Name: Rhonda Schroeder MRN: [id number] Date of Birth: 06-06-1991  Today's Date: 10/22/2011 Time: 0922-1014 Time Calculation (min): 52 min Visit#: 2  of 4   Charges: Therex x 42'  Subjective: Symptoms/Limitations Symptoms: My pain is not too high right now because I haven't been on my feet long. Pain Assessment Currently in Pain?: Yes Pain Score:   6 Pain Location: Back Pain Orientation: Lower   Exercise/Treatments UBE 4'@1 .0 backward (D/C next session, not needed) Standing Scapular Retraction: 10 reps;Theraband Theraband Level (Scapular Retraction): Level 3 (Green) Row: 10 reps;Theraband Theraband Level (Row): Level 3 (Green) Shoulder Extension: 10 reps Supine Ab Set: 10 reps;5 seconds;Limitations AB Set Limitations: TA contraction Bridge: 10 reps Sidelying Clam: 5 reps;Limitations Clam Limitations: 10" hold Hip Abduction: 10 reps Prone  Straight Leg Raise: 10 reps;Limitations Straight Leg Raises Limitations: with pillow under stomach  Physical Therapy Assessment and Plan PT Assessment and Plan Clinical Impression Statement: Pt completes exercises well after multimodal cueing for proper mm contraction. Pt appears to have increased pain in prone position. Pain decreased with pillow under stomach. HEP given for exercises taught today. Pt reports a slight increase in pain to 7/10 at end of session. Pt states that this is normal for her PT Plan: Begin prone heel squeeze, SAR, and opposite arm/leg next session.     Problem List Patient Active Problem List  Diagnoses  . Epigastric pain  . Hematochezia  . Anemia    PT - End of Session Activity Tolerance: Patient tolerated treatment well General Behavior During Session: [facility] for tasks performed Cognition: [facility] for tasks performed   Seth Bake, PTA 10/22/2011, 12:07 PM

## 2011-10-24 DIAGNOSIS — K589 Irritable bowel syndrome without diarrhea: Secondary | ICD-10-CM | POA: Insufficient documentation

## 2011-10-24 DIAGNOSIS — K648 Other hemorrhoids: Secondary | ICD-10-CM | POA: Insufficient documentation

## 2011-10-24 NOTE — Assessment & Plan Note (Signed)
Noted on physical exam. Hx of IBS-C, has noted intermittent loose stools at times. On Linzess 290 mcg, failed Amitiza due to nausea. Discussed controlling bowel habits to avoid diarrhea, constipation. Will trial QOD dosing, may need to decrease dose to 145. However, could be diet-related.   Anusol suppositories HF diet, increase water intake Trial Linzess QOD 3 mos

## 2011-10-24 NOTE — Assessment & Plan Note (Signed)
Constipation predominant. Linzezz 290, see "internal hemorrhoid". 3 mos f/u.

## 2011-10-26 ENCOUNTER — Ambulatory Visit (HOSPITAL_COMMUNITY)
Admission: RE | Admit: 2011-10-26 | Discharge: 2011-10-26 | Disposition: A | Discharge status: Home / Self Care | Payer: Medicaid Other | Source: Ambulatory Visit | Attending: Internal Medicine | Admitting: Internal Medicine

## 2011-10-26 NOTE — Progress Notes (Signed)
Physical Therapy Treatment Patient Details  Name: Rhonda Schroeder MRN: [id number] Date of Birth: 07-21-1990  Today's Date: 10/26/2011 Time: 9147-8295 Time Calculation (min): 46 min Visit#: 3  of 4   Charges: Therex x 42'  Subjective: Symptoms/Limitations Symptoms: Pt states TENS had been helping her get through the work day. Pain Assessment Currently in Pain?: Yes Pain Score:   7 Pain Location: Back Pain Orientation: Lower   Exercise/Treatments Stretches Active Hamstring Stretch: 3 reps;30 seconds Single Knee to Chest Stretch: 3 reps;30 seconds Standing Scapular Retraction: 10 reps;Theraband Theraband Level (Scapular Retraction): Level 3 (Green) Row: 10 reps;Theraband Theraband Level (Row): Level 3 (Green) Shoulder Extension: 10 reps Supine Ab Set: 5 reps;Limitations AB Set Limitations: TA contraction 10" Bent Knee Raise: 10 reps Bridge: 10 reps;3 seconds Large Ball Abdominal Isometric: 5 reps;5 seconds Large Ball Oblique Isometric: 5 reps;5 seconds Sidelying Clam: 5 reps;Limitations Clam Limitations: 10" hold Hip Abduction: 10 reps Prone  Single Arm Raise: 10 reps;Limitations Single Arm Raises Limitations: with pillow under stomach Straight Leg Raise: 10 reps;Limitations Straight Leg Raises Limitations: with pillow under stomach Opposite Arm/Leg Raise: 10 reps;Limitations Opposite Arm/Leg Raise Limitations: with pillow under stomach Other Prone Lumbar Exercises: Heel squeeze 10 x 5"  Physical Therapy Assessment and Plan PT Assessment and Plan Clinical Impression Statement: Pt displays improved mm control with stabilization exercises this session. HEP given for new exercises. Pt reports that she is working out at [employer] twice a week focusing on back and abdominal strength. Pt  reports pain decrease to 5/10 at end of session. PT Plan: Reassess next session.     Problem List Patient Active Problem List  Diagnoses  . Epigastric pain  . Hematochezia  .  Anemia  . Internal hemorrhoid  . IBS (irritable bowel syndrome)    PT - End of Session Activity Tolerance: Patient tolerated treatment well General Behavior During Session: [facility] for tasks performed Cognition: [facility] for tasks performed   Seth Bake, PTA 10/26/2011, 9:01 AM

## 2011-10-26 NOTE — Progress Notes (Signed)
Faxed to PCP

## 2011-10-29 ENCOUNTER — Other Ambulatory Visit: Payer: Self-pay | Admitting: Gastroenterology

## 2011-10-29 ENCOUNTER — Ambulatory Visit (HOSPITAL_COMMUNITY)
Admission: RE | Admit: 2011-10-29 | Discharge: 2011-10-29 | Disposition: A | Discharge status: Home / Self Care | Payer: Medicaid Other | Source: Ambulatory Visit | Attending: Internal Medicine | Admitting: Internal Medicine

## 2011-10-29 ENCOUNTER — Telehealth: Payer: Self-pay

## 2011-10-29 MED ORDER — LIDOCAINE-HYDROCORTISONE ACE 3-0.5 % RE CREA: 1.0000 | TOPICAL_CREAM | Freq: Two times a day (BID) | RECTAL | Status: DC

## 2011-10-29 NOTE — Telephone Encounter (Signed)
Pt was seen in office on 10/22/2011 by Gerrit Halls, NP. She was given suppositories for hemorrhoids. Pt states that she has been using them as directed. But for the last 2-3 days, she has had a lot more pain and it feels that it has increased in size. She has not been constipated, nor diarrhea but stools have been loose. Please advise!

## 2011-10-29 NOTE — Progress Notes (Signed)
Physical Therapy Treatment Patient Details  Name: ANAYIAH HOWDEN MRN: [id number] Date of Birth: 1991/02/17  Today's Date: 10/29/2011 Time: 3086-5784 Time Calculation (min): 45 min Visit#: 4  of 4   Charges: Therex x 40'  Subjective: Symptoms/Limitations Symptoms: I'm having a lot of pain today. I worked a 10 hour shift yesterday without my TENS because my pads won't stick. I ordered new ones and they are sending them. Pain Assessment Pain Score:   8 Pain Location: Back Pain Orientation: Lower   Exercise/Treatments Supine Bent Knee Raise: 10 reps Bridge: 15 reps Large Ball Abdominal Isometric: 10 reps;5 seconds Sidelying Clam: 5 reps;Limitations Clam Limitations: 10" hold Hip Abduction: 15 reps Prone  Single Arm Raise: 10 reps;Limitations Single Arm Raises Limitations: with pillow under stomach Straight Leg Raise: 10 reps;Limitations Straight Leg Raises Limitations: with pillow under stomach Opposite Arm/Leg Raise: 10 reps;Limitations Opposite Arm/Leg Raise Limitations: with pillow under stomach Other Prone Lumbar Exercises: Heel squeeze 10 x 5"  Physical Therapy Assessment and Plan PT Assessment and Plan Clinical Impression Statement: Pt compeltes all therex well with minimal need for cueing. HEP given for scapular tband exercises. Pt states that she feels comfortable completing exercises at home.  PT Plan: Recommend to PT D/C to HEP.    Goals 1. Goal: Decrease pain by 5 levels.  Baseline: best pain on 0-10 is a 6 worst is a 10/10  Duration: 4 Week(s) Progressing toward goal Best:4/10 Worst:10/10 2. Goal: I in core strengthening exercises.  Baseline: not completing any core exercises.  Duration: 4 Week(s) Met 3. Goal: able to lift 20 pounds without having increased pain.  Baseline: Pt has increased pain when lifting 15# or more.  Duration: 4 Week(s) Met 4. Goal: I in TENs unit  Baseline: not knowledgeable of TENS  Duration: 1 Week(s) Met  Problem  List Patient Active Problem List  Diagnoses  . Epigastric pain  . Hematochezia  . Anemia  . Internal hemorrhoid  . IBS (irritable bowel syndrome)    PT - End of Session Activity Tolerance: Patient tolerated treatment well General Behavior During Session: [facility] for tasks performed Cognition: [facility] for tasks performed   Seth Bake, PTA 10/29/2011, 11:41 AM

## 2011-10-29 NOTE — Telephone Encounter (Signed)
Pt informed

## 2011-10-29 NOTE — Telephone Encounter (Signed)
How many loose stools per day? Need to stop Linzess till resolves. If continues, will need further evaluation. Not helping hemorrhoid status.  Any bleeding? Is pain more severe? I am unable to see today because I leave early. Offer appt for tomorrow or early next week. If severe pain, present to ED.   Side note (physical exam not extremely impressive; however, loose stools likely exacerbating. Want to make sure not dealing with thrombosed hemorrhoid).

## 2011-10-29 NOTE — Telephone Encounter (Signed)
Called pt. She said that she has only had one stool daily (loose, not diarrhea) until yesterday. She had two yesterday, and has had two today thus far. She had a little bleeding once this AM. But the pain is bad and she can hardly wipe herself. Please advise!

## 2011-10-29 NOTE — Telephone Encounter (Signed)
Ok. If pain worsens or no relief, then contact us.

## 2011-10-29 NOTE — Telephone Encounter (Signed)
Called and informed the pt. She said that it is bulging and that it hurts to wipe or cleanse the area. She was informed the new prescription was sent to the pharmacy.

## 2011-10-29 NOTE — Telephone Encounter (Signed)
We will treat this as possible anal fissure. Does she note any "bulging" from her rectum? Will send in rx for medicine that has lidocaine in it. Make sure to stop Linzess for now. We need to reassess next week. I can see her in clinic, non-urgent slot. She needs to call back if medication doesn't help or worsening of symptoms. Don't take anusol suppositories while taking this. I believe she should be finished.

## 2011-11-02 ENCOUNTER — Ambulatory Visit (HOSPITAL_COMMUNITY): Payer: Medicaid Other | Admitting: *Deleted

## 2011-11-05 ENCOUNTER — Ambulatory Visit (HOSPITAL_COMMUNITY): Payer: Medicaid Other | Admitting: *Deleted

## 2011-11-05 ENCOUNTER — Ambulatory Visit: Payer: Medicaid Other | Admitting: Gastroenterology

## 2011-11-05 NOTE — Progress Notes (Signed)
REVIEWED.  

## 2011-11-09 ENCOUNTER — Ambulatory Visit (HOSPITAL_COMMUNITY): Payer: Medicaid Other | Admitting: *Deleted

## 2011-11-12 ENCOUNTER — Ambulatory Visit (HOSPITAL_COMMUNITY): Payer: Medicaid Other | Admitting: Physical Therapy

## 2011-11-16 ENCOUNTER — Telehealth: Payer: Self-pay | Admitting: Gastroenterology

## 2011-11-16 ENCOUNTER — Other Ambulatory Visit: Payer: Self-pay | Admitting: Gastroenterology

## 2011-11-16 NOTE — Telephone Encounter (Signed)
Pt called this afternoon saying that the cream we prescribed has helped with the pain, but she is concerned about the hemorrhoid still being there. She has questions about having possible surgery. Please advise and call patient back at [phone]

## 2011-11-16 NOTE — Telephone Encounter (Addendum)
Pt said this problem with hemorrhoids has been going on over a month. It is less painful but still very aggravating and she would like to know if Dr. Darrick Penna or Gerrit Halls, NP,  would recommend a surgical consult. Please advise!

## 2011-11-17 ENCOUNTER — Telehealth: Payer: Self-pay | Admitting: Gastroenterology

## 2011-11-17 NOTE — Telephone Encounter (Signed)
Pt scheduled to see Dr Lovell Sheehan on 05/23 @ 9:30- LMOVM- will also mail letter

## 2011-11-17 NOTE — Telephone Encounter (Signed)
Routing to Crystal.

## 2011-11-17 NOTE — Telephone Encounter (Signed)
See rec's from Dr. Darrick Penna.

## 2011-11-17 NOTE — Telephone Encounter (Signed)
REFER TO GENERAL SURGERY.

## 2011-11-17 NOTE — Telephone Encounter (Signed)
Thanks, Dr. Darrick Penna! Noted.

## 2011-11-17 NOTE — Telephone Encounter (Signed)
Referral faxed to Dr Jenkins/Zieglar  

## 2011-11-24 ENCOUNTER — Emergency Department (HOSPITAL_COMMUNITY)
Admission: EM | Admit: 2011-11-24 | Discharge: 2011-11-25 | Disposition: A | Discharge status: Home / Self Care | Payer: Medicaid Other | Attending: Emergency Medicine | Admitting: Emergency Medicine

## 2011-11-24 ENCOUNTER — Emergency Department (HOSPITAL_COMMUNITY): Payer: Medicaid Other

## 2011-11-24 ENCOUNTER — Encounter (HOSPITAL_COMMUNITY): Payer: Self-pay

## 2011-11-24 DIAGNOSIS — R569 Unspecified convulsions: Secondary | ICD-10-CM

## 2011-11-24 DIAGNOSIS — N949 Unspecified condition associated with female genital organs and menstrual cycle: Secondary | ICD-10-CM | POA: Insufficient documentation

## 2011-11-24 DIAGNOSIS — S0093XA Contusion of unspecified part of head, initial encounter: Secondary | ICD-10-CM

## 2011-11-24 DIAGNOSIS — R102 Pelvic and perineal pain: Secondary | ICD-10-CM

## 2011-11-24 DIAGNOSIS — S0003XA Contusion of scalp, initial encounter: Secondary | ICD-10-CM | POA: Insufficient documentation

## 2011-11-24 DIAGNOSIS — R10819 Abdominal tenderness, unspecified site: Secondary | ICD-10-CM | POA: Insufficient documentation

## 2011-11-24 DIAGNOSIS — K2970 Gastritis, unspecified, without bleeding: Secondary | ICD-10-CM

## 2011-11-24 DIAGNOSIS — K2990 Gastroduodenitis, unspecified, without bleeding: Secondary | ICD-10-CM | POA: Insufficient documentation

## 2011-11-24 DIAGNOSIS — N946 Dysmenorrhea, unspecified: Secondary | ICD-10-CM

## 2011-11-24 DIAGNOSIS — Z9089 Acquired absence of other organs: Secondary | ICD-10-CM | POA: Insufficient documentation

## 2011-11-24 DIAGNOSIS — R296 Repeated falls: Secondary | ICD-10-CM | POA: Insufficient documentation

## 2011-11-24 LAB — COMPREHENSIVE METABOLIC PANEL
ALT: 14 U/L (ref 0–35)
AST: 24 U/L (ref 0–37)
Albumin: 3.9 g/dL (ref 3.5–5.2)
CO2: 27 mEq/L (ref 19–32)
Calcium: 9.4 mg/dL (ref 8.4–10.5)
Creatinine, Ser: 0.88 mg/dL (ref 0.50–1.10)
GFR calc non Af Amer: >90 mL/min (ref >90)
Sodium: 140 mEq/L (ref 135–145)
Total Protein: 6.8 g/dL (ref 6.0–8.3)

## 2011-11-24 LAB — CBC
HCT: 35.4 % — ABNORMAL LOW (ref 36.0–46.0)
MCHC: 34.2 g/dL (ref 30.0–36.0)
MCV: 88.1 fL (ref 78.0–100.0)
Platelets: 163 10*3/uL (ref 150–400)
RDW: 13 % (ref 11.5–15.5)
WBC: 4.8 10*3/uL (ref 4.0–10.5)

## 2011-11-24 LAB — DIFFERENTIAL
Basophils Absolute: 0 10*3/uL (ref 0.0–0.1)
Basophils Relative: 0 % (ref 0–1)
Eosinophils Relative: 1 % (ref 0–5)
Lymphocytes Relative: 56 % — ABNORMAL HIGH (ref 12–46)
Monocytes Absolute: 0.3 10*3/uL (ref 0.1–1.0)

## 2011-11-24 LAB — URINE MICROSCOPIC-ADD ON

## 2011-11-24 LAB — URINALYSIS, ROUTINE W REFLEX MICROSCOPIC
Bilirubin Urine: NEGATIVE
Ketones, ur: NEGATIVE mg/dL
Nitrite: NEGATIVE
Urobilinogen, UA: 0.2 mg/dL (ref 0.0–1.0)

## 2011-11-24 LAB — WET PREP, GENITAL: Clue Cells Wet Prep HPF POC: NONE SEEN

## 2011-11-24 MED ORDER — SODIUM CHLORIDE 0.9 % IV SOLN
500.0000 mg | Freq: Once | INTRAVENOUS | Status: AC
  Administered 2011-11-25: 500 mg via INTRAVENOUS
  Filled 2011-11-24: qty 5

## 2011-11-24 MED ORDER — GI COCKTAIL ~~LOC~~
30.0000 mL | Freq: Once | ORAL | Status: AC
  Administered 2011-11-24: 30 mL via ORAL
  Filled 2011-11-24: qty 30

## 2011-11-24 MED ORDER — SODIUM CHLORIDE 0.9 % IV BOLUS (SEPSIS)
1000.0000 mL | Freq: Once | INTRAVENOUS | Status: AC
  Administered 2011-11-25: 1000 mL via INTRAVENOUS

## 2011-11-24 MED ORDER — ONDANSETRON 8 MG PO TBDP
8.0000 mg | ORAL_TABLET | Freq: Once | ORAL | Status: AC
  Administered 2011-11-24: 8 mg via ORAL
  Filled 2011-11-24: qty 1

## 2011-11-24 NOTE — ED Notes (Signed)
Pt reports ab pain for 2 weeks, feeling bloated, +nausea, no vomiting, no fever, no diarrhea.  Denies any vaginal discharge or urinary s/s. Currently on period

## 2011-11-24 NOTE — ED Notes (Signed)
Pt found in floor w/ seizure like activity. Pt head protected, called for additional staff. Pt palced back in bed & seizure pads placed on bed.

## 2011-11-24 NOTE — ED Notes (Signed)
Abrasion noted below right eye. Second abrasion noted to right forehead. Swelling above right eyebrow. Patient denies history of seizures. Denies head pain. Only complaining of abdominal pain.

## 2011-11-24 NOTE — ED Provider Notes (Signed)
History  This chart was scribed for Ward Givens, MD by Bennett Scrape. This patient was seen in room [facility]16A/[facility]16A and the patient's care was started at 8:34PM.  CSN: [id number]  Arrival date & time 11/24/11  4098   First MD Initiated Contact with Patient 11/24/11 2034      Chief Complaint  Patient presents with  . Abdominal Pain    Patient is a 21 y.o. female presenting with abdominal pain. The history is provided by the patient. No language interpreter was used.  Abdominal Pain The primary symptoms of the illness include abdominal pain and nausea. The primary symptoms of the illness do not include fever, vomiting, diarrhea, dysuria or vaginal discharge. The current episode started more than 2 days ago. The onset of the illness was gradual. The problem has been gradually worsening.  The abdominal pain is generalized. The abdominal pain radiates to the back. The abdominal pain is relieved by nothing. The abdominal pain is exacerbated by eating.  Additional symptoms associated with the illness include back pain. Symptoms associated with the illness do not include chills. Significant associated medical issues do not include GERD, gallstones or diverticulitis.     Nylah Butkus Hammonds is a 21 y.o. female who presents to the Emergency Department complaining of 3 weeks of gradual onset, gradually worsening, constant diffuse abdominal pain described as a burning pressure that radiates shooting pains to her lower back with associated bloating, abdominal cramping and nausea. She also reports 6 lbs of weight gain since the onset of the symptoms. She states that she has tried exercising more and changing her diet by excluding fatty, greasy and spicy foods for no improvement in the symptoms. She has tried Midol, Aleve, gas relievers and other OTC medications with no improvement in her symptoms.  She reports that the pain is worse with eating. She also reports increased frequency. Pt has a h/o  cholecystectomy and states that pain is similar to the pain experienced with her gallbladder but is more severe. She states that she has not seen her PCP for the symptoms, because "I wanted to make sure they were real and not just fleeting pains". She denies diarrhea, vaginal discharge, dysuria and urinary incontinenance. Pt states that she is on the Nuvaring and reports her LNMP started today. Pt states that she takes her protonix daily as prescribed. She is a rare alcohol user but denies smoking. Pt is G2P1Ab0. Denies new sexual partner.  PCP is Dr. Concepcion Elk in [place]   Past Medical History  Diagnosis Date  . Abnormal Pap smear of cervix 12/2010    [facility]-due for BX  . S/P endoscopy Aug 2012    mild gastritis  . Seizures     Past Surgical History  Procedure Date  . Extraction of wisdom teeth   . Esophagogastroduodenoscopy 02/16/2011    Procedure: ESOPHAGOGASTRODUODENOSCOPY (EGD);  Surgeon: Arlyce Harman, MD;  Location[facility];  Service: Endoscopy;  Laterality: N/A;  . Flexible sigmoidoscopy 02/16/2011    Procedure: FLEXIBLE SIGMOIDOSCOPY;  Surgeon: Arlyce Harman, MD;  Location: [facility];  Service: Endoscopy;  Laterality: N/A;  . Cholecystectomy August 2012  . Colonoscopy 08/07/2011    Internal hemorrhoids/ Nl colonoscopy [facility]    Family History  Problem Relation Age of Onset  . Hypertension Mother   . Colon cancer Neg Hx     History  Substance Use Topics  . Smoking status: Never Smoker   . Smokeless tobacco: Not on file  .  Alcohol Use: Yes     Rarely  Pt works as a Conservation officer, nature at [employer].   OB History    Grav Para Term Preterm Abortions TAB SAB Ect Mult Living   2 1   1     1       Review of Systems  Constitutional: Positive for unexpected weight change. Negative for fever and chills.  Gastrointestinal: Positive for nausea and abdominal pain. Negative for vomiting and diarrhea.  Genitourinary: Negative for dysuria and vaginal discharge.    Musculoskeletal: Positive for back pain.  All other systems reviewed and are negative.    Allergies  Review of patient's allergies indicates no known allergies.  Home Medications   Current Outpatient Rx  Name Route Sig Dispense Refill  . ETONOGESTREL-ETHINYL ESTRADIOL 0.12-0.015 MG/24HR VA RING Vaginal Place 1 each vaginally every 28 (twenty-eight) days. Insert vaginally and leave in place for 3 consecutive weeks, then remove for 1 week.     Marland Kitchen HYDROCORTISONE ACETATE 25 MG RE SUPP Rectal Place 1 suppository (25 mg total) rectally every 12 (twelve) hours. 14 suppository 1  . LIDOCAINE-HYDROCORTISONE ACE 3-0.5 % RE CREA Rectal Place 1 Applicatorful rectally 2 (two) times daily. For 5 days. 1 Tube 0  . LINACLOTIDE 290 MCG PO CAPS Oral Take 1 capsule by mouth daily before breakfast. Take 30 mins before breakfast on empty stomach. DO NOT take with food. 30 capsule 5  . MULTIVITAMINS PO CAPS Oral Take 1 capsule by mouth daily.      Marland Kitchen PANTOPRAZOLE SODIUM 40 MG PO TBEC Oral Take 1 tablet (40 mg total) by mouth daily. 30 tablet 11    Triage Vitals: BP 128/79  Pulse 76  Temp(Src) 98 F (36.7 C) (Oral)  Resp 20  Ht 5\' 8"  (1.727 m)  Wt 175 lb (79.379 kg)  BMI 26.61 kg/m2  SpO2 100%  LMP 11/24/2011  Vital signs normal    Physical Exam  Nursing note and vitals reviewed. Constitutional: She is oriented to person, place, and time. She appears well-developed and well-nourished.  Non-toxic appearance. She does not appear ill. No distress.  HENT:  Head: Normocephalic and atraumatic.  Right Ear: External ear normal.  Left Ear: External ear normal.  Nose: Nose normal. No mucosal edema or rhinorrhea.  Mouth/Throat: Oropharynx is clear and moist and mucous membranes are normal. No dental abscesses or uvula swelling.       Slightly dry mucus membranes  Eyes: Conjunctivae and EOM are normal. Pupils are equal, round, and reactive to light.  Neck: Normal range of motion and full passive range of  motion without pain. Neck supple. No tracheal deviation present.  Cardiovascular: Normal rate, regular rhythm and normal heart sounds.  Exam reveals no gallop and no friction rub.   No murmur heard. Pulmonary/Chest: Effort normal and breath sounds normal. No respiratory distress. She has no wheezes. She has no rhonchi. She has no rales. She exhibits no tenderness and no crepitus.  Abdominal: Soft. Normal appearance and bowel sounds are normal. She exhibits no distension. There is tenderness (Diffusely tender throughout abdomen). There is no rebound and no guarding.  Genitourinary:       Normal external genitalia, patient has moderate blood in the vault, she has mild tenderness to palpation of her normal-sized uterus, and her ovaries are also mildly tender without masses.  Musculoskeletal: Normal range of motion. She exhibits no edema and no tenderness.       Moves all extremities well.   Neurological: She is alert  and oriented to person, place, and time. She has normal strength. No cranial nerve deficit.  Skin: Skin is warm, dry and intact. No rash noted. No erythema. No pallor.  Psychiatric: She has a normal mood and affect. Her speech is normal and behavior is normal. Her mood appears not anxious.    ED Course  Procedures (including critical care time)  DIAGNOSTIC STUDIES: Oxygen Saturation is 100% on room air, normal by my interpretation.    COORDINATION OF CARE: 8:57PM-Discussed treatment plan which includes pelvic exam and urinalysis with pt and pt agreed to plan. 11:02PM-Pt rechecked and feels better. Discussed discharge plan of antibiotics with pt and pt agreed to plan. Advised pt to take her protonixs for the next 2 weeks to settle the stomach acid down.  2340 patient had gotten dressed to be discharged and nurses report they found her on the floor having seizure activity. When I checked her she was post ictal however she did state she had a history of seizures as a child and as an  adult, however she states her last seizure was a couple of years ago. Patient now has a new abrasion/contusion to her forehead near the hairline and above her right eye, she has a superficial abrasion on her right second toe and bruises noted on her extremities.   01:30 AM patient states she had seizures as a child and they stopped when she was about 21 years old, she states she had a seizure in 2010. She relates she's on a waiting list to be seen at [facility] neurology for evaluation of headaches and her concern of having another seizure again. She is agreeable to see Dr. Modesto Charon or try to see [facility] neurolog[facility] again.   Medications  gi cocktail (Maalox,Lidocaine,Donnatal) (30 mL Oral Given 11/24/11 2103)  levETIRAcetam (KEPPRA) 500 mg in sodium chloride 0.9 % 100 mL IVPB (500 mg Intravenous Given 11/25/11 0102)  sodium chloride 0.9 % bolus 1,000 mL (1000 mL Intravenous Given 11/25/11 0046)  ondansetron (ZOFRAN) injection 4 mg (4 mg Intravenous Given 11/25/11 0046)  morphine 4 MG/ML injection 4 mg (4 mg Intravenous Given 11/25/11 0046)     Results for orders placed during the hospital encounter of 11/24/11  CBC      Component Value Range   WBC 4.8  4.0 - 10.5 (K/uL)   RBC 4.02  3.87 - 5.11 (MIL/uL)   Hemoglobin 12.1  12.0 - 15.0 (g/dL)   HCT 45.4 (*) 09.8 - 46.0 (%)   MCV 88.1  78.0 - 100.0 (fL)   MCH 30.1  26.0 - 34.0 (pg)   MCHC 34.2  30.0 - 36.0 (g/dL)   RDW 11.9  14.7 - 82.9 (%)   Platelets 163  150 - 400 (K/uL)  DIFFERENTIAL      Component Value Range   Neutrophils Relative 38 (*) 43 - 77 (%)   Neutro Abs 1.8  1.7 - 7.7 (K/uL)   Lymphocytes Relative 56 (*) 12 - 46 (%)   Lymphs Abs 2.7  0.7 - 4.0 (K/uL)   Monocytes Relative 5  3 - 12 (%)   Monocytes Absolute 0.3  0.1 - 1.0 (K/uL)   Eosinophils Relative 1  0 - 5 (%)   Eosinophils Absolute 0.0  0.0 - 0.7 (K/uL)   Basophils Relative 0  0 - 1 (%)   Basophils Absolute 0.0  0.0 - 0.1 (K/uL)  COMPREHENSIVE METABOLIC PANEL      Component  Value Range   Sodium 140  135 -  145 (mEq/L)   Potassium 3.9  3.5 - 5.1 (mEq/L)   Chloride 104  96 - 112 (mEq/L)   CO2 27  19 - 32 (mEq/L)   Glucose, Bld 85  70 - 99 (mg/dL)   BUN 10  6 - 23 (mg/dL)   Creatinine, Ser 1.61  0.50 - 1.10 (mg/dL)   Calcium 9.4  8.4 - 09.6 (mg/dL)   Total Protein 6.8  6.0 - 8.3 (g/dL)   Albumin 3.9  3.5 - 5.2 (g/dL)   AST 24  0 - 37 (U/L)   ALT 14  0 - 35 (U/L)   Alkaline Phosphatase 39  39 - 117 (U/L)   Total Bilirubin 0.4  0.3 - 1.2 (mg/dL)   GFR calc non Af Amer >90  >90 (mL/min)   GFR calc Af Amer >90  >90 (mL/min)  LIPASE, BLOOD      Component Value Range   Lipase 41  11 - 59 (U/L)  URINALYSIS, ROUTINE W REFLEX MICROSCOPIC      Component Value Range   Color, Urine STRAW (*) YELLOW    APPearance CLEAR  CLEAR    Specific Gravity, Urine <1.005 (*) 1.005 - 1.030    pH 6.5  5.0 - 8.0    Glucose, UA NEGATIVE  NEGATIVE (mg/dL)   Hgb urine dipstick MODERATE (*) NEGATIVE    Bilirubin Urine NEGATIVE  NEGATIVE    Ketones, ur NEGATIVE  NEGATIVE (mg/dL)   Protein, ur NEGATIVE  NEGATIVE (mg/dL)   Urobilinogen, UA 0.2  0.0 - 1.0 (mg/dL)   Nitrite NEGATIVE  NEGATIVE    Leukocytes, UA NEGATIVE  NEGATIVE   GC/CHLAMYDIA PROBE AMP, GENITAL      Component Value Range   GC Probe Amp, Genital NEGATIVE  NEGATIVE    Chlamydia, DNA Probe NEGATIVE  NEGATIVE   RPR      Component Value Range   RPR NON REACTIVE  NON REACTIVE   WET PREP, GENITAL      Component Value Range   Yeast Wet Prep HPF POC NONE SEEN  NONE SEEN    Trich, Wet Prep NONE SEEN  NONE SEEN    Clue Cells Wet Prep HPF POC NONE SEEN  NONE SEEN    WBC, Wet Prep HPF POC RARE (*) NONE SEEN   PREGNANCY, URINE      Component Value Range   Preg Test, Ur NEGATIVE  NEGATIVE   URINE MICROSCOPIC-ADD ON      Component Value Range   Squamous Epithelial / LPF FEW (*) RARE    WBC, UA 3-6  <3 (WBC/hpf)   RBC / HPF 3-6  <3 (RBC/hpf)   Bacteria, UA RARE  RARE    CT HEAD WITHOUT CONTRAST  Technique: Contiguous  axial images were obtained from the base of  the skull through the vertex without contrast.  Comparison: None.  Findings: There is no evidence for acute hemorrhage, hydrocephalus,  mass lesion, or abnormal extra-axial fluid collection. No definite  CT evidence for acute infarction. The visualized paranasal sinuses  and mastoid air cells are predominately clear. Mild right frontal  scalp swelling. No underlying calvarial fracture.  IMPRESSION:  Mild right frontal scalp swelling. No underlying calvarial  fracture or acute intracranial abnormality.  Original Report Authenticated By: Waneta Martins, M.D.        1. Gastritis   2. Seizure   3. Contusion of head   4. Pelvic pain   5. Menses painful    New Prescriptions   HYDROCODONE-ACETAMINOPHEN (  NORCO) 5-325 MG PER TABLET    Take 1 or 2 po Q 6hrs for pain   LEVETIRACETAM (KEPPRA) 500 MG TABLET    Take 1 tablet (500 mg total) by mouth every 12 (twelve) hours.   METRONIDAZOLE (FLAGYL) 500 MG TABLET    Take 1 tablet (500 mg total) by mouth 3 (three) times daily.    Plan discharge  Devoria Albe, MD, FACEP    MDM   I personally performed the services described in this documentation, which was scribed in my presence. The recorded information has been reviewed and considered. Devoria Albe, MD, Armando Gang    Ward Givens, MD 11/28/11 [id number]

## 2011-11-24 NOTE — ED Notes (Signed)
In & Out Cath done to collect urine sample. Patient tolerated well. Sample collected and sent to lab.

## 2011-11-25 ENCOUNTER — Encounter: Payer: Self-pay | Admitting: Neurology

## 2011-11-25 MED ORDER — ONDANSETRON HCL 4 MG/2ML IJ SOLN
4.0000 mg | Freq: Once | INTRAMUSCULAR | Status: AC
  Administered 2011-11-25: 4 mg via INTRAVENOUS
  Filled 2011-11-25: qty 2

## 2011-11-25 MED ORDER — LEVETIRACETAM 500 MG PO TABS: 500.0000 mg | ORAL_TABLET | Freq: Two times a day (BID) | ORAL | Status: DC

## 2011-11-25 MED ORDER — HYDROCODONE-ACETAMINOPHEN 5-325 MG PO TABS: ORAL_TABLET | ORAL | Status: DC

## 2011-11-25 MED ORDER — LEVETIRACETAM 500 MG/5ML IV SOLN
INTRAVENOUS | Status: AC
  Filled 2011-11-25: qty 5

## 2011-11-25 MED ORDER — METRONIDAZOLE 500 MG PO TABS: 500.0000 mg | ORAL_TABLET | Freq: Three times a day (TID) | ORAL | Status: AC

## 2011-11-25 MED ORDER — MORPHINE SULFATE 4 MG/ML IJ SOLN
4.0000 mg | Freq: Once | INTRAMUSCULAR | Status: AC
  Administered 2011-11-25: 4 mg via INTRAVENOUS
  Filled 2011-11-25: qty 1

## 2011-11-25 NOTE — ED Notes (Signed)
Patient's fiance at bedside. Patient alert and oriented at this time.

## 2011-11-25 NOTE — Discharge Instructions (Signed)
Drink plenty of fluids. Take your Protonix twice a day for the next 2 weeks then back down to once a day. Start taking the Keppra for your seizure. NO DRIVING UNTIL YOU ARE RELEASED BY A NEUROLOGIST TO DRIVE AGAIN. Ice packs to the swollen area on your head that you bumped when you had the seizure. You can try getting an appointment again at [facility] or try Dr Modesto Charon.  Take the antibiotic until gone for your pelvic pain.  Recheck if you get a fever, worsening pain, or uncontrolled vomiting.

## 2011-11-26 ENCOUNTER — Emergency Department (HOSPITAL_COMMUNITY): Payer: Medicaid Other

## 2011-11-26 ENCOUNTER — Encounter (HOSPITAL_COMMUNITY): Payer: Self-pay | Admitting: *Deleted

## 2011-11-26 ENCOUNTER — Emergency Department (HOSPITAL_COMMUNITY)
Admission: EM | Admit: 2011-11-26 | Discharge: 2011-11-26 | Disposition: A | Discharge status: Home / Self Care | Payer: Medicaid Other | Attending: Emergency Medicine | Admitting: Emergency Medicine

## 2011-11-26 DIAGNOSIS — R569 Unspecified convulsions: Secondary | ICD-10-CM | POA: Insufficient documentation

## 2011-11-26 DIAGNOSIS — W1789XA Other fall from one level to another, initial encounter: Secondary | ICD-10-CM | POA: Insufficient documentation

## 2011-11-26 DIAGNOSIS — S161XXA Strain of muscle, fascia and tendon at neck level, initial encounter: Secondary | ICD-10-CM

## 2011-11-26 DIAGNOSIS — M542 Cervicalgia: Secondary | ICD-10-CM | POA: Insufficient documentation

## 2011-11-26 DIAGNOSIS — S139XXA Sprain of joints and ligaments of unspecified parts of neck, initial encounter: Secondary | ICD-10-CM | POA: Insufficient documentation

## 2011-11-26 DIAGNOSIS — Y9210 Unspecified residential institution as the place of occurrence of the external cause: Secondary | ICD-10-CM | POA: Insufficient documentation

## 2011-11-26 HISTORY — DX: Unspecified convulsions: R56.9

## 2011-11-26 LAB — RPR: RPR Ser Ql: NONREACTIVE

## 2011-11-26 MED ORDER — METHOCARBAMOL 500 MG PO TABS: 1000.0000 mg | ORAL_TABLET | Freq: Four times a day (QID) | ORAL | Status: AC

## 2011-11-26 MED ORDER — HYDROCODONE-ACETAMINOPHEN 5-325 MG PO TABS
1.0000 | ORAL_TABLET | Freq: Once | ORAL | Status: AC
  Administered 2011-11-26: 1 via ORAL
  Filled 2011-11-26: qty 1

## 2011-11-26 MED ORDER — IBUPROFEN 600 MG PO TABS: 600.0000 mg | ORAL_TABLET | Freq: Three times a day (TID) | ORAL | Status: AC

## 2011-11-26 NOTE — ED Notes (Signed)
Pt was here 2 days ago and had a seizure,and fell off stretcher, Says her neck is hurting.

## 2011-11-26 NOTE — ED Notes (Signed)
Pt presents with c/o secondary to a witnessed fall during last ED visit due to a seizure. Pt has abrasions on right forehead that are appearing to heal well. Pt reports stiff neck with neck pain since witnessed fall per pt. Pt denies LOC, vision changes and headache at this time. Pt is alert and orientated x 4. Skin pink warm and dry to touch.

## 2011-11-26 NOTE — ED Provider Notes (Signed)
History     CSN: [id number]  Arrival date & time 11/26/11  1507   First MD Initiated Contact with Patient 11/26/11 1620      Chief Complaint  Patient presents with  . Neck Pain    (Consider location/radiation/quality/duration/timing/severity/associated sxs/prior treatment) HPI Comments: Rhonda Schroeder presents for persistent pain in her neck which started 2 days ago. She was here being treated for abdominal pain (which is better) when she had a seizure (history of seizures),  Falling out of her stretcher bed,  Causing injury to her head and neck.  She had a CT scan of her head which was normal.  She has neck pain which has become worse since this event.  She denies numbness and weakness in her extremities, has no fevers,  Chills or neck rigidity. She has been taking hydrocodone which improves her pain but makes her very sleepy.  Pain is worse with movement and palpation,  And described as sharp and throbbing.   Patient is a 21 y.o. female presenting with neck pain. The history is provided by the patient.  Neck Pain  Pertinent negatives include no chest pain, no numbness and no weakness.    Past Medical History  Diagnosis Date  . Abnormal Pap smear of cervix 12/2010    [facility] Dept-due for BX  . S/P endoscopy Aug 2012    mild gastritis  . Seizures     Past Surgical History  Procedure Date  . Extraction of wisdom teeth   . Esophagogastroduodenoscopy 02/16/2011    Procedure: ESOPHAGOGASTRODUODENOSCOPY (EGD);  Surgeon: Arlyce Harman, MD;  Location: [facility];  Service: Endoscopy;  Laterality: N/A;  . Flexible sigmoidoscopy 02/16/2011    Procedure: FLEXIBLE SIGMOIDOSCOPY;  Surgeon: Arlyce Harman, MD;  Location: [facility];  Service: Endoscopy;  Laterality: N/A;  . Cholecystectomy August 2012  . Colonoscopy 08/07/2011    Internal hemorrhoids/ Nl colonoscopy [facility]    Family History  Problem Relation Age of Onset  . Hypertension Mother   . Colon cancer Neg Hx      History  Substance Use Topics  . Smoking status: Never Smoker   . Smokeless tobacco: Not on file  . Alcohol Use: Yes     Rarely    OB History    Grav Para Term Preterm Abortions TAB SAB Ect Mult Living                  Review of Systems  Constitutional: Negative for fever.  HENT: Positive for neck pain.   Respiratory: Negative for shortness of breath.   Cardiovascular: Negative for chest pain and leg swelling.  Gastrointestinal: Negative for abdominal pain, constipation and abdominal distention.  Genitourinary: Negative for dysuria, urgency, frequency, flank pain and difficulty urinating.  Musculoskeletal: Negative for back pain, joint swelling and gait problem.  Skin: Negative for rash.  Neurological: Negative for weakness and numbness.    Allergies  Review of patient's allergies indicates no known allergies.  Home Medications   Current Outpatient Rx  Name Route Sig Dispense Refill  . ETONOGESTREL-ETHINYL ESTRADIOL 0.12-0.015 MG/24HR VA RING Vaginal Place 1 each vaginally every 28 (twenty-eight) days. Insert vaginally and leave in place for 3 consecutive weeks, then remove for 1 week.    Marland Kitchen HYDROCODONE-ACETAMINOPHEN 5-325 MG PO TABS  Take 1 or 2 po Q 6hrs for pain 8 tablet 0  . LEVETIRACETAM 500 MG PO TABS Oral Take 1 tablet (500 mg total) by mouth every 12 (twelve) hours.  60 tablet 0  . LINACLOTIDE 290 MCG PO CAPS Oral Take 1 capsule by mouth daily before breakfast. Take 30 mins before breakfast on empty stomach. DO NOT take with food. 30 capsule 5  . METRONIDAZOLE 500 MG PO TABS Oral Take 1 tablet (500 mg total) by mouth 3 (three) times daily. 30 tablet 0  . MULTIVITAMINS PO CAPS Oral Take 1 capsule by mouth every evening.     Marland Kitchen PANTOPRAZOLE SODIUM 40 MG PO TBEC Oral Take 40 mg by mouth every morning.    . IBUPROFEN 600 MG PO TABS Oral Take 1 tablet (600 mg total) by mouth 3 (three) times daily. 21 tablet 0  . METHOCARBAMOL 500 MG PO TABS Oral Take 2 tablets (1,000  mg total) by mouth 4 (four) times daily. 40 tablet 0    BP 111/55  Pulse 60  Temp(Src) 97.9 F (36.6 C) (Oral)  Resp 20  Ht 5\' 8"  (1.727 m)  Wt 174 lb (78.926 kg)  BMI 26.46 kg/m2  SpO2 100%  LMP 11/24/2011  Physical Exam  Nursing note and vitals reviewed. Constitutional: She appears well-developed and well-nourished.  HENT:  Head: Normocephalic.  Eyes: Conjunctivae are normal.  Neck: Normal range of motion. Neck supple. Muscular tenderness present. No spinous process tenderness present. No rigidity. No erythema and normal range of motion present.  Cardiovascular: Normal rate and intact distal pulses.        Pedal pulses normal.  Pulmonary/Chest: Effort normal.  Abdominal: Soft. Bowel sounds are normal. She exhibits no distension and no mass.  Musculoskeletal: Normal range of motion. She exhibits no edema.       Lumbar back: She exhibits tenderness. She exhibits no swelling, no edema and no spasm.  Neurological: She is alert. She has normal strength and normal reflexes. She displays no atrophy and no tremor. No sensory deficit. She exhibits normal muscle tone. Gait normal.  Reflex Scores:      Bicep reflexes are 2+ on the right side and 2+ on the left side.      Equal grip strength  Skin: Skin is warm and dry.  Psychiatric: She has a normal mood and affect.    ED Course  Procedures (including critical care time)  Labs Reviewed - No data to display Dg Cervical Spine Complete  11/26/2011  *RADIOLOGY REPORT*  Clinical Data: Seizure, fall, neck pain.  CERVICAL SPINE - COMPLETE 4+ VIEW  Comparison: None.  Findings: No fracture or malalignment.  Prevertebral soft tissues are normal.  Disc spaces well maintained.  Cervicothoracic junction normal.  IMPRESSION: Normal study.  Original Report Authenticated By: Cyndie Chime, M.D.   Ct Head Wo Contrast  11/25/2011  *RADIOLOGY REPORT*  Clinical Data: Seizure-like activity, right forehead abrasion.  CT HEAD WITHOUT CONTRAST  Technique:   Contiguous axial images were obtained from the base of the skull through the vertex without contrast.  Comparison: None.  Findings: There is no evidence for acute hemorrhage, hydrocephalus, mass lesion, or abnormal extra-axial fluid collection.  No definite CT evidence for acute infarction.  The visualized paranasal sinuses and mastoid air cells are predominately clear.  Mild right frontal scalp swelling.  No underlying calvarial fracture.  IMPRESSION: Mild right frontal scalp swelling.  No underlying calvarial fracture or acute intracranial abnormality.  Original Report Authenticated By: Waneta Martins, M.D.     1. Cervical strain, acute       MDM  xrays reviewed prior to dc home.  Pt prescribed robaxin,  Also encouraged heat  applied 20 minutes 3-4 times daily.  Recheck by pcp if not better over the next week.  Pt afebrile. No neuro deficits to suggest cord compression.  Sx and mechanism most c/w cervical strain and muscle spasm.        Burgess Amor, PA 11/27/11 0028

## 2011-11-26 NOTE — Discharge Instructions (Signed)
Cervical Strain Care After A cervical strain is when the muscles and ligaments in your neck have been stretched. The bones are not broken. If you had any problems moving your arms or legs immediately after the injury, even if the problem has gone away, make sure to tell this to your caregiver.  HOME CARE INSTRUCTIONS   While awake, apply ice packs to the neck or areas of pain about every 1 to 2 hours, for 15 to 20 minutes at a time. Do this for 2 days. If you were given a cervical collar for support, ask your caregiver if you may remove it for bathing or applying ice.   If given a cervical collar, wear as instructed. Do not remove any collar unless instructed by a caregiver.   Only take over-the-counter or prescription medicines for pain, discomfort, or fever as directed by your caregiver.  Recheck with the hospital or clinic after a radiologist has read your X-rays. Recheck with the hospital or clinic to make sure the initial readings are correct. Do this also to determine if you need further studies. It is your responsibility to find out your X-ray results. X-rays are sometimes repeated in one week to ten days. These are often repeated to make sure that a hairline fracture was not overlooked. Ask your caregiver how you are to find out about your radiology (X-ray) results. SEEK IMMEDIATE MEDICAL CARE IF:   You have increasing pain in your neck.   You develop difficulties swallowing or breathing.   You have numbness, weakness, or movement problems in the arms or legs.   You have difficulty walking.   You develop bowel or bladder retention or incontinence.   You have problems with walking.  MAKE SURE YOU:   Understand these instructions.   Will watch your condition.   Will get help right away if you are not doing well or get worse.  Document Released: 06/22/2005 Document Revised: 03/04/2011 Document Reviewed: 02/03/2008 [facility] Patient Information 2012 [place], [place].   You may  continue using the hydrocodone you were prescribed at your last visit.  Add  the 2 new medications for inflammation and for muscle relaxation.  Apply heat to your neck and shoulders 20 minutes 3-4 times daily.  See your Dr. for recheck if not improved over the next week.  Your x-rays tonight are normal.

## 2011-11-26 NOTE — ED Notes (Signed)
Patient transported to X-ray via stretcher 

## 2011-11-27 NOTE — ED Provider Notes (Signed)
Medical screening examination/treatment/procedure(s) were performed by non-physician practitioner and as supervising physician I was immediately available for consultation/collaboration.   Glynn Octave, MD 11/27/11 [phone]

## 2011-11-29 ENCOUNTER — Emergency Department (HOSPITAL_COMMUNITY)
Admission: EM | Admit: 2011-11-29 | Discharge: 2011-11-29 | Disposition: A | Discharge status: Home / Self Care | Payer: Medicaid Other | Attending: Emergency Medicine | Admitting: Emergency Medicine

## 2011-11-29 ENCOUNTER — Encounter (HOSPITAL_COMMUNITY): Payer: Self-pay | Admitting: *Deleted

## 2011-11-29 DIAGNOSIS — R1032 Left lower quadrant pain: Secondary | ICD-10-CM | POA: Insufficient documentation

## 2011-11-29 DIAGNOSIS — R110 Nausea: Secondary | ICD-10-CM | POA: Insufficient documentation

## 2011-11-29 DIAGNOSIS — R42 Dizziness and giddiness: Secondary | ICD-10-CM | POA: Insufficient documentation

## 2011-11-29 DIAGNOSIS — R51 Headache: Secondary | ICD-10-CM | POA: Insufficient documentation

## 2011-11-29 MED ORDER — OXYCODONE-ACETAMINOPHEN 5-325 MG PO TABS: 1.0000 | ORAL_TABLET | ORAL | Status: AC | PRN

## 2011-11-29 MED ORDER — ONDANSETRON HCL 4 MG PO TABS: 4.0000 mg | ORAL_TABLET | Freq: Four times a day (QID) | ORAL | Status: AC

## 2011-11-29 NOTE — ED Notes (Signed)
She tells me that she was seen at [facility] this Tues. For a two-week hx of lower abd. Pain.  While in their E.D. She states she received IV fluid and was dx with u.t.i. And d/c'd. On oral antibiotics.  She states that while undergoing d/c procedure while at [facility], she "had a seizure and fell off of the stretcher".  She states they performed C.T., which was "normal".  She is seeking evaluation today for persistent/worsening lower abd./bladder area pain.  She is alert, oriented x 4 and in no distress.  She c/o feeling "dizzy, especially whenever I stand up".  Her pre-school age daughter is with her.

## 2011-11-29 NOTE — ED Notes (Signed)
Pt from home via EMS with reports of being treated at [facility] on Tuesday for abdominal pain and upon discharge home had a seizure and fell off stretcher. Bruising and abrasions noted to extremities and right forehead. Pt endorses headache, fatigue, dizziness and not being able to focus since the incident on Tuesday. Pt also reports no improvement in lower abdominal pain after taking oral antibiotics that were given on Tuesday. Pt endorses hx of seizures between ages 2-8 with no seizure activity until 2010 and none again until last Tuesday.

## 2011-11-29 NOTE — ED Provider Notes (Signed)
History     CSN: [id number]  Arrival date & time 11/29/11  1541   First MD Initiated Contact with Patient 11/29/11 1643      Chief Complaint  Patient presents with  . Dizziness  . Fatigue  . Abdominal Pain    lower  . Nausea    (Consider location/radiation/quality/duration/timing/severity/associated sxs/prior treatment) Patient is a 21 y.o. female presenting with abdominal pain and headaches. The history is provided by the patient.  Abdominal Pain The primary symptoms of the illness include abdominal pain, nausea and dysuria. The primary symptoms of the illness do not include fever, shortness of breath or vaginal discharge. The current episode started more than 2 days ago. The onset of the illness was gradual. The problem has not changed since onset. The abdominal pain is located in the suprapubic region, LLQ and RLQ.  Associated symptoms comments: She has had lower abdominal pain for over 3 weeks. She has also had same pain in the past prompting full GI evaluation with upper and lower endoscopies. She has an appointment with GYN on June 3 to discuss further evaluation. She reports pain increases with eating and with urination. She is currently on antibiotics for UTI started 3 days ago when seen at [facility] ER for same. No fever. Nausea with vomiting. She denies vaginal discharge or irregular bleeding.Marland Kitchen  Headache  This is a new problem. Associated symptoms include nausea. Pertinent negatives include no fever and no shortness of breath. Associated symptoms comments: During an evaluation for her abdominal pain and while at [facility] for same, she had a seizure causing her to fall and hit the floor. She reports she has a history of seizures previously on Topamax but not taking medications in many months. A CT scan done at the time of fall was negative, per patient. She reports feeling a persistent mild headache, dizziness, difficulty focusing. No further seizure activity..     Past Medical History  Diagnosis Date  . Abnormal Pap smear of cervix 12/2010    [facility] Dept-due for BX  . S/P endoscopy Aug 2012    mild gastritis  . Seizures     Past Surgical History  Procedure Date  . Extraction of wisdom teeth   . Esophagogastroduodenoscopy 02/16/2011    Procedure: ESOPHAGOGASTRODUODENOSCOPY (EGD);  Surgeon: Arlyce Harman, MD;  Location[facility];  Service: Endoscopy;  Laterality: N/A;  . Flexible sigmoidoscopy 02/16/2011    Procedure: FLEXIBLE SIGMOIDOSCOPY;  Surgeon: Arlyce Harman, MD;  Location: [facility] END[facility];  Service: Endoscopy;  Laterality: N/A;  . Cholecystectomy August 2012  . Colonoscopy 08/07/2011    Internal hemorrhoids/ Nl colonoscopy [facility]    Family History  Problem Relation Age of Onset  . Hypertension Mother   . Colon cancer Neg Hx     History  Substance Use Topics  . Smoking status: Never Smoker   . Smokeless tobacco: Not on file  . Alcohol Use: Yes     Rarely    OB History    Grav Para Term Preterm Abortions TAB SAB Ect Mult Living                  Review of Systems  Constitutional: Negative for fever.  HENT: Negative for neck pain.   Eyes: Negative for photophobia and visual disturbance.  Respiratory: Negative for cough and shortness of breath.   Cardiovascular: Negative for chest pain.  Gastrointestinal: Positive for nausea and abdominal pain.  Genitourinary: Positive for dysuria. Negative  for vaginal discharge.  Neurological: Positive for dizziness and headaches.  Psychiatric/Behavioral: Positive for altered mental status.    Allergies  Review of patient's allergies indicates no known allergies.  Home Medications   Current Outpatient Rx  Name Route Sig Dispense Refill  . ETONOGESTREL-ETHINYL ESTRADIOL 0.12-0.015 MG/24HR VA RING Vaginal Place 1 each vaginally every 28 (twenty-eight) days. Insert vaginally and leave in place for 3 consecutive weeks, then remove for 1 week.    Marland Kitchen  HYDROCODONE-ACETAMINOPHEN 5-325 MG PO TABS  Take 1 or 2 po Q 6hrs for pain 8 tablet 0  . IBUPROFEN 600 MG PO TABS Oral Take 1 tablet (600 mg total) by mouth 3 (three) times daily. 21 tablet 0  . LEVETIRACETAM 500 MG PO TABS Oral Take 1 tablet (500 mg total) by mouth every 12 (twelve) hours. 60 tablet 0  . LINACLOTIDE 290 MCG PO CAPS Oral Take 1 capsule by mouth daily before breakfast. Take 30 mins before breakfast on empty stomach. DO NOT take with food. 30 capsule 5  . METHOCARBAMOL 500 MG PO TABS Oral Take 2 tablets (1,000 mg total) by mouth 4 (four) times daily. 40 tablet 0  . METRONIDAZOLE 500 MG PO TABS Oral Take 1 tablet (500 mg total) by mouth 3 (three) times daily. 30 tablet 0  . MULTIVITAMINS PO CAPS Oral Take 1 capsule by mouth every evening.     Marland Kitchen PANTOPRAZOLE SODIUM 40 MG PO TBEC Oral Take 40 mg by mouth every morning.      BP 118/68  Pulse 72  Temp(Src) 98.5 F (36.9 C) (Oral)  Resp 18  Ht 5\' 8"  (1.727 m)  Wt 172 lb 13.5 oz (78.4 kg)  BMI 26.28 kg/m2  SpO2 100%  LMP 11/24/2011  Physical Exam  Constitutional: She appears well-developed and well-nourished. No distress.  HENT:  Head: Normocephalic.  Eyes: Conjunctivae are normal. Pupils are equal, round, and reactive to light.  Neck: Normal range of motion.  Cardiovascular: Normal rate.   No murmur heard. Pulmonary/Chest: Effort normal. She has no wheezes. She has no rales.  Abdominal: Soft. Bowel sounds are normal. She exhibits no distension. There is no rebound and no guarding.       Mildly tender across lower abdomen. No guarding or rebound.     ED Course  Procedures (including critical care time)  Labs Reviewed - No data to display No results found.   No diagnosis found. 1. Abdominal pain 2. Headache    MDM  Chart reviewed. Pelvic labs, UA, blood studies all negative on visit 11-26-11 at [facility]. Symptoms have not changed, they are persistent, and are over 3 weeks in duration this episode, with  similar symptoms in the past. Doubt emergent infection or condition. Will refer to urology (dysuria) and encourage her to keep her appointment with her GYN. Pain control offered, nausea medicine to be given. Dizziness, mild headache likely associated with having started Keppra. CT head reviewed and is negative following fall on 11-26-11.         Rodena Medin, PA-C 11/29/11 1816

## 2011-11-29 NOTE — Discharge Instructions (Signed)
FOLLOW UP WITH DR. Isabel Caprice AS WELL AS KEEP YOUR SCHEDULED APPOINTMENT WITH YOUR GYNECOLOGIST FOR FURTHER OUTPATIENT EVALUATION OF PERSISTENT ABDOMINAL PAIN. CONTINUE YOUR CURRENT MEDICATIONS. YOU CAN STOP VICODIN AND TAKE PERCOCET FOR BETTER PAIN CONTROL. RETURN HERE WITH ANY HIGH FEVER, SEVERE PAIN OR NEW CONCERN.

## 2011-11-30 NOTE — ED Provider Notes (Signed)
Medical screening examination/treatment/procedure(s) were performed by non-physician practitioner and as supervising physician I was immediately available for consultation/collaboration.  Johnelle Tafolla R. Royston Bekele, MD 11/30/11 0003 

## 2011-12-23 ENCOUNTER — Other Ambulatory Visit: Payer: Self-pay | Admitting: Gastroenterology

## 2012-01-04 ENCOUNTER — Telehealth: Payer: Self-pay | Admitting: Gastroenterology

## 2012-01-04 MED ORDER — LINACLOTIDE 145 MCG PO CAPS: 1.0000 | ORAL_CAPSULE | Freq: Every day | ORAL | Status: DC | PRN

## 2012-01-04 NOTE — Telephone Encounter (Signed)
Pt called back. Said she has moved away. She is no longer in this area. She would like the prescription sent to the [facility] for the 145 mcg tablerts.

## 2012-01-04 NOTE — Addendum Note (Signed)
Addended by: Joselyn Arrow on: 01/04/2012 01:37 PM   Modules accepted: Orders

## 2012-01-04 NOTE — Telephone Encounter (Signed)
Pt called to see if SF would change her medicine for constipation (she doesn't remember the name) where she can take 1/2 a dose. Please advise and call her at [phone]

## 2012-01-04 NOTE — Telephone Encounter (Signed)
Rhonda Schroeder, please call pt to see if she is talking about linzess. If so, she may have tablets daily instead. She may pick up 1 week's samples to try. Thanks

## 2012-01-04 NOTE — Telephone Encounter (Signed)
LMOM to call.

## 2012-01-05 NOTE — Telephone Encounter (Signed)
REVIEWED.  

## 2012-01-20 ENCOUNTER — Telehealth: Payer: Self-pay | Admitting: Gastroenterology

## 2012-01-20 NOTE — Telephone Encounter (Signed)
Pt called to cancel her OV for tomorrow. [place]

## 2012-01-21 ENCOUNTER — Ambulatory Visit: Payer: Medicaid Other | Admitting: Urgent Care

## 2012-01-21 NOTE — Telephone Encounter (Signed)
noted 

## 2012-01-26 ENCOUNTER — Ambulatory Visit: Payer: Medicaid Other | Admitting: Neurology

## 2012-07-06 HISTORY — PX: FRACTURE SURGERY: SHX138

## 2012-08-16 DIAGNOSIS — S92009A Unspecified fracture of unspecified calcaneus, initial encounter for closed fracture: Secondary | ICD-10-CM | POA: Insufficient documentation

## 2013-01-26 DIAGNOSIS — M19079 Primary osteoarthritis, unspecified ankle and foot: Secondary | ICD-10-CM | POA: Insufficient documentation

## 2013-09-11 DIAGNOSIS — G40309 Generalized idiopathic epilepsy and epileptic syndromes, not intractable, without status epilepticus: Secondary | ICD-10-CM | POA: Insufficient documentation

## 2013-12-02 DIAGNOSIS — Z659 Problem related to unspecified psychosocial circumstances: Secondary | ICD-10-CM | POA: Insufficient documentation

## 2013-12-14 DIAGNOSIS — Z9889 Other specified postprocedural states: Secondary | ICD-10-CM | POA: Insufficient documentation

## 2013-12-15 DIAGNOSIS — Z789 Other specified health status: Secondary | ICD-10-CM | POA: Insufficient documentation

## 2013-12-26 DIAGNOSIS — Z87828 Personal history of other (healed) physical injury and trauma: Secondary | ICD-10-CM | POA: Insufficient documentation

## 2014-07-28 DIAGNOSIS — M545 Low back pain, unspecified: Secondary | ICD-10-CM | POA: Insufficient documentation

## 2014-07-28 DIAGNOSIS — F5101 Primary insomnia: Secondary | ICD-10-CM | POA: Insufficient documentation

## 2014-08-31 DIAGNOSIS — K5900 Constipation, unspecified: Secondary | ICD-10-CM | POA: Insufficient documentation

## 2014-11-04 DIAGNOSIS — F419 Anxiety disorder, unspecified: Secondary | ICD-10-CM | POA: Insufficient documentation

## 2014-11-28 DIAGNOSIS — G894 Chronic pain syndrome: Secondary | ICD-10-CM | POA: Insufficient documentation

## 2015-04-08 ENCOUNTER — Encounter: Payer: Self-pay | Admitting: Emergency Medicine

## 2015-04-08 DIAGNOSIS — R102 Pelvic and perineal pain: Secondary | ICD-10-CM | POA: Diagnosis not present

## 2015-04-08 DIAGNOSIS — Z79899 Other long term (current) drug therapy: Secondary | ICD-10-CM | POA: Insufficient documentation

## 2015-04-08 DIAGNOSIS — N939 Abnormal uterine and vaginal bleeding, unspecified: Secondary | ICD-10-CM | POA: Diagnosis present

## 2015-04-08 DIAGNOSIS — Z3202 Encounter for pregnancy test, result negative: Secondary | ICD-10-CM | POA: Diagnosis not present

## 2015-04-08 NOTE — ED Notes (Signed)
Pt presents to ED with c/o pelvic pain associated with nausea, vaginal bleed and light headedness, onset about 3 weeks. Pt states pain radiates to lower back. Pt also reports had 2 episode of seizure 2 days ago. Pt states she was seen at [facility] family medicine and was told she might have possible cancer. Scheduled to be seen on 04/24/2015 but she for worsening pain.

## 2015-04-09 ENCOUNTER — Emergency Department
Admission: EM | Admit: 2015-04-09 | Discharge: 2015-04-09 | Disposition: A | Discharge status: Home / Self Care | Payer: Medicaid Other | Attending: Emergency Medicine | Admitting: Emergency Medicine

## 2015-04-09 ENCOUNTER — Emergency Department: Payer: Medicaid Other

## 2015-04-09 DIAGNOSIS — R102 Pelvic and perineal pain: Secondary | ICD-10-CM

## 2015-04-09 LAB — COMPREHENSIVE METABOLIC PANEL
ALBUMIN: 4.5 g/dL (ref 3.5–5.0)
ALK PHOS: 78 U/L (ref 38–126)
ALT: 14 U/L (ref 14–54)
ANION GAP: 6 (ref 5–15)
AST: 21 U/L (ref 15–41)
BILIRUBIN TOTAL: 0.8 mg/dL (ref 0.3–1.2)
BUN: 8 mg/dL (ref 6–20)
CALCIUM: 9 mg/dL (ref 8.9–10.3)
CO2: 23 mmol/L (ref 22–32)
Chloride: 112 mmol/L — ABNORMAL HIGH (ref 101–111)
Creatinine, Ser: 0.83 mg/dL (ref 0.44–1.00)
GFR calc Af Amer: >60 mL/min (ref >60)
GLUCOSE: 95 mg/dL (ref 65–99)
POTASSIUM: 3.7 mmol/L (ref 3.5–5.1)
Sodium: 141 mmol/L (ref 135–145)
TOTAL PROTEIN: 7.3 g/dL (ref 6.5–8.1)

## 2015-04-09 LAB — CBC
HCT: 47.6 % — ABNORMAL HIGH (ref 35.0–47.0)
Hemoglobin: 15.9 g/dL (ref 12.0–16.0)
MCH: 31.2 pg (ref 26.0–34.0)
MCHC: 33.3 g/dL (ref 32.0–36.0)
MCV: 93.6 fL (ref 80.0–100.0)
Platelets: 210 10*3/uL (ref 150–440)
RBC: 5.08 MIL/uL (ref 3.80–5.20)
RDW: 12.2 % (ref 11.5–14.5)
WBC: 6.3 10*3/uL (ref 3.6–11.0)

## 2015-04-09 LAB — POCT PREGNANCY, URINE: PREG TEST UR: NEGATIVE

## 2015-04-09 NOTE — Discharge Instructions (Signed)
Please seek medical attention for any high fevers, chest pain, shortness of breath, change in behavior, persistent vomiting, bloody stool or any other new or concerning symptoms.   Pelvic Pain Female pelvic pain can be caused by many different things and start from a variety of places. Pelvic pain refers to pain that is located in the lower half of the abdomen and between your hips. The pain may occur over a short period of time (acute) or may be reoccurring (chronic). The cause of pelvic pain may be related to disorders affecting the female reproductive organs (gynecologic), but it may also be related to the bladder, kidney stones, an intestinal complication, or muscle or skeletal problems. Getting help right away for pelvic pain is important, especially if there has been severe, sharp, or a sudden onset of unusual pain. It is also important to get help right away because some types of pelvic pain can be life threatening.  CAUSES  Below are only some of the causes of pelvic pain. The causes of pelvic pain can be in one of several categories.   Gynecologic.  Pelvic inflammatory disease.  Sexually transmitted infection.  Ovarian cyst or a twisted ovarian ligament (ovarian torsion).  Uterine lining that grows outside the uterus (endometriosis).  Fibroids, cysts, or tumors.  Ovulation.  Pregnancy.  Pregnancy that occurs outside the uterus (ectopic pregnancy).  Miscarriage.  Labor.  Abruption of the placenta or ruptured uterus.  Infection.  Uterine infection (endometritis).  Bladder infection.  Diverticulitis.  Miscarriage related to a uterine infection (septic abortion).  Bladder.  Inflammation of the bladder (cystitis).  Kidney stone(s).  Gastrointestinal.  Constipation.  Diverticulitis.  Neurologic.  Trauma.  Feeling pelvic pain because of mental or emotional causes (psychosomatic).  Cancers of the bowel or pelvis. EVALUATION  Your caregiver will want to  take a careful history of your concerns. This includes recent changes in your health, a careful gynecologic history of your periods (menses), and a sexual history. Obtaining your family history and medical history is also important. Your caregiver may suggest a pelvic exam. A pelvic exam will help identify the location and severity of the pain. It also helps in the evaluation of which organ system may be involved. In order to identify the cause of the pelvic pain and be properly treated, your caregiver may order tests. These tests may include:   A pregnancy test.  Pelvic ultrasonography.  An X-ray exam of the abdomen.  A urinalysis or evaluation of vaginal discharge.  Blood tests. HOME CARE INSTRUCTIONS   Only take over-the-counter or prescription medicines for pain, discomfort, or fever as directed by your caregiver.   Rest as directed by your caregiver.   Eat a balanced diet.   Drink enough fluids to make your urine clear or pale yellow, or as directed.   Avoid sexual intercourse if it causes pain.   Apply warm or cold compresses to the lower abdomen depending on which one helps the pain.   Avoid stressful situations.   Keep a journal of your pelvic pain. Write down when it started, where the pain is located, and if there are things that seem to be associated with the pain, such as food or your menstrual cycle.  Follow up with your caregiver as directed.  SEEK MEDICAL CARE IF:  Your medicine does not help your pain.  You have abnormal vaginal discharge. SEEK IMMEDIATE MEDICAL CARE IF:   You have heavy bleeding from the vagina.   Your pelvic pain increases.  You feel light-headed or faint.   You have chills.   You have pain with urination or blood in your urine.   You have uncontrolled diarrhea or vomiting.   You have a fever or persistent symptoms for more than 3 days.  You have a fever and your symptoms suddenly get worse.   You are being  physically or sexually abused.  MAKE SURE YOU:  Understand these instructions.  Will watch your condition.  Will get help if you are not doing well or get worse. Document Released: 05/19/2004 Document Revised: 11/06/2013 Document Reviewed: 10/12/2011 [facility] Patient Information 2015 [place], [place]. This information is not intended to replace advice given to you by your health care provider. Make sure you discuss any questions you have with your health care provider.

## 2015-04-09 NOTE — ED Notes (Signed)
Pt attempted to give urine sample but missed the cup

## 2015-04-09 NOTE — ED Provider Notes (Signed)
[facility] Emergency Department Provider Note    ____________________________________________  Time seen: 0210  I have reviewed the triage vital signs and the nursing notes.   HISTORY  Chief Complaint Pelvic Pain and Vaginal Bleeding   History limited by: Not Limited   HPI Rhonda Schroeder is a 24 y.o. female who presents to the emergency department today because of pelvic pain. Patient states that this pain is been going on for 3 weeks. It is constant. She has had dyspareunia. She additionally has had some vaginal bleeding during this time. She did see her family medicine doctor 3 days ago who performed a pelvic exam. Wet prep and chlamydia gonorrhea from that exam were both negative. Additionally the patient was scheduled for colposcopy given an abnormal finding on the cervix. Patient denies any recent fevers, change in bowel movement or dysuria.    Past Medical History  Diagnosis Date  . Abnormal Pap smear of cervix 12/2010    [facility] Dept-due for BX  . S/P endoscopy Aug 2012    mild gastritis  . Seizures [facility])     Patient Active Problem List   Diagnosis Date Noted  . Internal hemorrhoid 10/24/2011  . IBS (irritable bowel syndrome) 10/24/2011  . Anemia 07/20/2011  . Epigastric pain 01/21/2011  . Hematochezia 01/21/2011    Past Surgical History  Procedure Laterality Date  . Extraction of wisdom teeth    . Esophagogastroduodenoscopy  02/16/2011    Procedure: ESOPHAGOGASTRODUODENOSCOPY (EGD);  Surgeon: Arlyce Harman, MD;  Location[facility];  Service: Endoscopy;  Laterality: N/A;  . Flexible sigmoidoscopy  02/16/2011    Procedure: FLEXIBLE SIGMOIDOSCOPY;  Surgeon: Arlyce Harman, MD;  Location: [facility];  Service: Endoscopy;  Laterality: N/A;  . Cholecystectomy  August 2012  . Colonoscopy  08/07/2011    Internal hemorrhoids/ Nl colonoscopy [facility]    Current Outpatient Rx  Name  Route  Sig  Dispense  Refill  .  etonogestrel-ethinyl estradiol (NUVARING) 0.12-0.015 MG/24HR vaginal ring   Vaginal   Place 1 each vaginally every 28 (twenty-eight) days. Insert vaginally and leave in place for 3 consecutive weeks, then remove for 1 week.         Marland Kitchen HYDROcodone-acetaminophen (NORCO) 5-325 MG per tablet      Take 1 or 2 po Q 6hrs for pain   8 tablet   0   . EXPIRED: levETIRAcetam (KEPPRA) 500 MG tablet   Oral   Take 1 tablet (500 mg total) by mouth every 12 (twelve) hours.   60 tablet   0   . Linaclotide 145 MCG CAPS   Oral   Take 1 capsule by mouth daily as needed (constipation).   30 capsule   5   . Multiple Vitamin (MULTIVITAMIN) capsule   Oral   Take 1 capsule by mouth every evening.          . pantoprazole (PROTONIX) 40 MG tablet   Oral   Take 40 mg by mouth every morning.           Allergies Review of patient's allergies indicates no known allergies.  Family History  Problem Relation Age of Onset  . Hypertension Mother   . Colon cancer Neg Hx     Social History Social History  Substance Use Topics  . Smoking status: Never Smoker   . Smokeless tobacco: None  . Alcohol Use: Yes     Comment: Rarely    Review of Systems  Constitutional:  Negative for fever. Cardiovascular: Negative for chest pain. Respiratory: Negative for shortness of breath. Gastrointestinal: Positive for pelvic pain Genitourinary: Negative for dysuria. Musculoskeletal: Negative for back pain. Skin: Negative for rash. Neurological: Negative for headaches, focal weakness or numbness.  10-point ROS otherwise negative.  ____________________________________________   PHYSICAL EXAM:  VITAL SIGNS: ED Triage Vitals  Enc Vitals Group     BP 04/08/15 2232 144/80 mmHg     Pulse Rate 04/08/15 2232 86     Resp 04/08/15 2232 18     Temp 04/08/15 2232 98.4 F (36.9 C)     Temp Source 04/08/15 2232 Oral     SpO2 04/08/15 2232 99 %     Weight --      Height --      Head Cir --      Peak Flow  --      Pain Score 04/08/15 2230 8   Constitutional: Alert and oriented. Well appearing and in no distress. Eyes: Conjunctivae are normal. PERRL. Normal extraocular movements. ENT   Head: Normocephalic and atraumatic.   Nose: No congestion/rhinnorhea.   Mouth/Throat: Mucous membranes are moist.   Neck: No stridor. Hematological/Lymphatic/Immunilogical: No cervical lymphadenopathy. Cardiovascular: Normal rate, regular rhythm.  No murmurs, rubs, or gallops. Respiratory: Normal respiratory effort without tachypnea nor retractions. Breath sounds are clear and equal bilaterally. No wheezes/rales/rhonchi. Gastrointestinal: Soft and minimally tender in the suprapubic region Genitourinary: Deferred Musculoskeletal: Normal range of motion in all extremities. No joint effusions.  No lower extremity tenderness nor edema. Neurologic:  Normal speech and language. No gross focal neurologic deficits are appreciated. Speech is normal.  Skin:  Skin is warm, dry and intact. No rash noted. Psychiatric: Mood and affect are normal. Speech and behavior are normal. Patient exhibits appropriate insight and judgment.  ____________________________________________    LABS (pertinent positives/negatives)  Labs Reviewed  COMPREHENSIVE METABOLIC PANEL - Abnormal; Notable for the following:    Chloride 112 (*)    All other components within normal limits  CBC - Abnormal; Notable for the following:    HCT 47.6 (*)    All other components within normal limits  POCT PREGNANCY, URINE     ____________________________________________   EKG  None  ____________________________________________    RADIOLOGY  Pelvic US IMPRESSION: Unremarkable pelvic ultrasound. No evidence for ovarian torsion.   ____________________________________________   PROCEDURES  Procedure(s) performed: None  Critical Care performed: No  ____________________________________________   INITIAL IMPRESSION /  ASSESSMENT AND PLAN / ED COURSE  Pertinent labs & imaging results that were available during my care of the patient were reviewed by me and considered in my medical decision making (see chart for details).  Patient presents to the emergency department today because of concerns for pelvic pain weeks. She had a workup by her family medicine doctor which included a negative wet prep and chlamydia gonorrhea. Ultrasound performed today did not show any acute abnormalities. Patient does have an appointment coming up for colposcopy.  ____________________________________________   FINAL CLINICAL IMPRESSION(S) / ED DIAGNOSES  Pelvic Pain  Phineas Semen, MD 04/09/15 [phone]

## 2015-11-09 DIAGNOSIS — H6690 Otitis media, unspecified, unspecified ear: Secondary | ICD-10-CM | POA: Insufficient documentation

## 2016-05-07 DIAGNOSIS — F1011 Alcohol abuse, in remission: Secondary | ICD-10-CM | POA: Insufficient documentation

## 2016-07-06 HISTORY — PX: TUBAL LIGATION: SHX77

## 2017-12-29 DIAGNOSIS — Z9889 Other specified postprocedural states: Secondary | ICD-10-CM | POA: Insufficient documentation

## 2018-01-14 DIAGNOSIS — R300 Dysuria: Secondary | ICD-10-CM | POA: Insufficient documentation

## 2018-01-14 DIAGNOSIS — L659 Nonscarring hair loss, unspecified: Secondary | ICD-10-CM | POA: Insufficient documentation

## 2018-01-14 DIAGNOSIS — N898 Other specified noninflammatory disorders of vagina: Secondary | ICD-10-CM | POA: Insufficient documentation

## 2018-01-17 DIAGNOSIS — N390 Urinary tract infection, site not specified: Secondary | ICD-10-CM | POA: Insufficient documentation

## 2018-07-12 DIAGNOSIS — M6701 Short Achilles tendon (acquired), right ankle: Secondary | ICD-10-CM | POA: Insufficient documentation

## 2018-07-12 DIAGNOSIS — G8929 Other chronic pain: Secondary | ICD-10-CM | POA: Insufficient documentation

## 2018-07-12 DIAGNOSIS — M722 Plantar fascial fibromatosis: Secondary | ICD-10-CM | POA: Insufficient documentation

## 2018-09-16 ENCOUNTER — Other Ambulatory Visit: Payer: Self-pay

## 2018-09-16 ENCOUNTER — Inpatient Hospital Stay (HOSPITAL_COMMUNITY)
Admit: 2018-09-16 | Discharge: 2018-09-16 | Disposition: A | Discharge status: Home / Self Care | Payer: Medicaid Other | Attending: Student in an Organized Health Care Education/Training Program | Admitting: Student in an Organized Health Care Education/Training Program

## 2018-09-16 ENCOUNTER — Encounter (HOSPITAL_COMMUNITY): Payer: Self-pay | Admitting: Internal Medicine

## 2018-09-16 ENCOUNTER — Emergency Department (HOSPITAL_COMMUNITY): Payer: Medicaid Other

## 2018-09-16 ENCOUNTER — Inpatient Hospital Stay (HOSPITAL_COMMUNITY)
Admission: EM | Admit: 2018-09-16 | Discharge: 2018-09-17 | DRG: 101 | Disposition: A | Discharge status: Home / Self Care | Payer: Medicaid Other | Attending: Internal Medicine | Admitting: Internal Medicine

## 2018-09-16 DIAGNOSIS — Z8249 Family history of ischemic heart disease and other diseases of the circulatory system: Secondary | ICD-10-CM | POA: Diagnosis not present

## 2018-09-16 DIAGNOSIS — F445 Conversion disorder with seizures or convulsions: Secondary | ICD-10-CM | POA: Diagnosis not present

## 2018-09-16 DIAGNOSIS — Z9049 Acquired absence of other specified parts of digestive tract: Secondary | ICD-10-CM | POA: Diagnosis not present

## 2018-09-16 DIAGNOSIS — K589 Irritable bowel syndrome without diarrhea: Secondary | ICD-10-CM | POA: Diagnosis present

## 2018-09-16 DIAGNOSIS — Z79891 Long term (current) use of opiate analgesic: Secondary | ICD-10-CM | POA: Diagnosis not present

## 2018-09-16 DIAGNOSIS — E872 Acidosis: Secondary | ICD-10-CM | POA: Diagnosis present

## 2018-09-16 DIAGNOSIS — R569 Unspecified convulsions: Secondary | ICD-10-CM | POA: Diagnosis present

## 2018-09-16 DIAGNOSIS — F410 Panic disorder [episodic paroxysmal anxiety] without agoraphobia: Secondary | ICD-10-CM | POA: Diagnosis present

## 2018-09-16 DIAGNOSIS — G43909 Migraine, unspecified, not intractable, without status migrainosus: Secondary | ICD-10-CM | POA: Diagnosis present

## 2018-09-16 DIAGNOSIS — Z79899 Other long term (current) drug therapy: Secondary | ICD-10-CM

## 2018-09-16 DIAGNOSIS — F329 Major depressive disorder, single episode, unspecified: Secondary | ICD-10-CM | POA: Diagnosis present

## 2018-09-16 DIAGNOSIS — D649 Anemia, unspecified: Secondary | ICD-10-CM | POA: Diagnosis present

## 2018-09-16 DIAGNOSIS — G43001 Migraine without aura, not intractable, with status migrainosus: Secondary | ICD-10-CM

## 2018-09-16 DIAGNOSIS — R519 Headache, unspecified: Secondary | ICD-10-CM

## 2018-09-16 DIAGNOSIS — R51 Headache: Secondary | ICD-10-CM | POA: Diagnosis not present

## 2018-09-16 DIAGNOSIS — K588 Other irritable bowel syndrome: Secondary | ICD-10-CM | POA: Diagnosis not present

## 2018-09-16 DIAGNOSIS — G40909 Epilepsy, unspecified, not intractable, without status epilepticus: Secondary | ICD-10-CM | POA: Diagnosis present

## 2018-09-16 HISTORY — DX: Migraine, unspecified, not intractable, without status migrainosus: G43.909

## 2018-09-16 HISTORY — DX: Unspecified convulsions: R56.9

## 2018-09-16 HISTORY — DX: Conversion disorder with seizures or convulsions: F44.5

## 2018-09-16 LAB — COMPREHENSIVE METABOLIC PANEL
ALT: 12 U/L (ref 0–44)
AST: 22 U/L (ref 15–41)
Albumin: 3.8 g/dL (ref 3.5–5.0)
Alkaline Phosphatase: 41 U/L (ref 38–126)
Anion gap: 10 (ref 5–15)
BUN: 11 mg/dL (ref 6–20)
CO2: 17 mmol/L — ABNORMAL LOW (ref 22–32)
Calcium: 8.6 mg/dL — ABNORMAL LOW (ref 8.9–10.3)
Chloride: 116 mmol/L — ABNORMAL HIGH (ref 98–111)
Creatinine, Ser: 0.87 mg/dL (ref 0.44–1.00)
GFR calc Af Amer: >60 mL/min (ref >60)
GFR calc non Af Amer: >60 mL/min (ref >60)
Glucose, Bld: 85 mg/dL (ref 70–99)
Potassium: 3.8 mmol/L (ref 3.5–5.1)
Sodium: 143 mmol/L (ref 135–145)
Total Bilirubin: 0.4 mg/dL (ref 0.3–1.2)
Total Protein: 6.2 g/dL — ABNORMAL LOW (ref 6.5–8.1)

## 2018-09-16 LAB — RAPID URINE DRUG SCREEN, HOSP PERFORMED
AMPHETAMINES: NOT DETECTED
BARBITURATES: NOT DETECTED
Benzodiazepines: NOT DETECTED
Cocaine: NOT DETECTED
OPIATES: NOT DETECTED
TETRAHYDROCANNABINOL: NOT DETECTED

## 2018-09-16 LAB — CBC WITH DIFFERENTIAL/PLATELET
ABS IMMATURE GRANULOCYTES: 0.01 10*3/uL (ref 0.00–0.07)
BASOS PCT: 0 %
Basophils Absolute: 0 10*3/uL (ref 0.0–0.1)
Eosinophils Absolute: 0.1 10*3/uL (ref 0.0–0.5)
Eosinophils Relative: 1 %
HCT: 37.1 % (ref 36.0–46.0)
HEMOGLOBIN: 12.2 g/dL (ref 12.0–15.0)
Immature Granulocytes: 0 %
LYMPHS PCT: 46 %
Lymphs Abs: 2.3 10*3/uL (ref 0.7–4.0)
MCH: 30.8 pg (ref 26.0–34.0)
MCHC: 32.9 g/dL (ref 30.0–36.0)
MCV: 93.7 fL (ref 80.0–100.0)
MONO ABS: 0.4 10*3/uL (ref 0.1–1.0)
MONOS PCT: 8 %
NEUTROS ABS: 2.3 10*3/uL (ref 1.7–7.7)
Neutrophils Relative %: 45 %
Platelets: 176 10*3/uL (ref 150–400)
RBC: 3.96 MIL/uL (ref 3.87–5.11)
RDW: 12.5 % (ref 11.5–15.5)
WBC: 5 10*3/uL (ref 4.0–10.5)
nRBC: 0 % (ref 0.0–0.2)

## 2018-09-16 LAB — ETHANOL: Alcohol, Ethyl (B): <10 mg/dL (ref <10)

## 2018-09-16 LAB — MAGNESIUM: Magnesium: 1.9 mg/dL (ref 1.7–2.4)

## 2018-09-16 LAB — PHOSPHORUS: Phosphorus: 3.8 mg/dL (ref 2.5–4.6)

## 2018-09-16 LAB — I-STAT BETA HCG BLOOD, ED (MC, WL, AP ONLY): I-stat hCG, quantitative: <5 m[IU]/mL (ref <5)

## 2018-09-16 LAB — CBG MONITORING, ED: Glucose-Capillary: 81 mg/dL (ref 70–99)

## 2018-09-16 MED ORDER — KETOROLAC TROMETHAMINE 15 MG/ML IJ SOLN
15.0000 mg | Freq: Four times a day (QID) | INTRAMUSCULAR | Status: DC | PRN
  Administered 2018-09-16 – 2018-09-17 (×2): 15 mg via INTRAVENOUS
  Filled 2018-09-16 (×2): qty 1

## 2018-09-16 MED ORDER — METOCLOPRAMIDE HCL 5 MG/ML IJ SOLN
10.0000 mg | Freq: Once | INTRAMUSCULAR | Status: AC
  Administered 2018-09-16: 10 mg via INTRAVENOUS
  Filled 2018-09-16: qty 2

## 2018-09-16 MED ORDER — TOPIRAMATE 100 MG PO TABS
100.0000 mg | ORAL_TABLET | Freq: Two times a day (BID) | ORAL | Status: DC
  Administered 2018-09-16 – 2018-09-17 (×2): 100 mg via ORAL
  Filled 2018-09-16 (×2): qty 1

## 2018-09-16 MED ORDER — TRAMADOL HCL 50 MG PO TABS
50.0000 mg | ORAL_TABLET | Freq: Four times a day (QID) | ORAL | Status: DC | PRN
  Administered 2018-09-16 – 2018-09-17 (×2): 50 mg via ORAL
  Filled 2018-09-16 (×2): qty 1

## 2018-09-16 MED ORDER — LEVETIRACETAM IN NACL 1000 MG/100ML IV SOLN
1000.0000 mg | Freq: Once | INTRAVENOUS | Status: AC
  Administered 2018-09-16: 1000 mg via INTRAVENOUS
  Filled 2018-09-16: qty 100

## 2018-09-16 MED ORDER — DIPHENHYDRAMINE HCL 50 MG/ML IJ SOLN
25.0000 mg | Freq: Once | INTRAMUSCULAR | Status: AC
  Administered 2018-09-16: 25 mg via INTRAVENOUS
  Filled 2018-09-16: qty 1

## 2018-09-16 MED ORDER — LEVETIRACETAM 500 MG PO TABS
1250.0000 mg | ORAL_TABLET | Freq: Two times a day (BID) | ORAL | Status: DC
  Administered 2018-09-16 – 2018-09-17 (×2): 1250 mg via ORAL
  Filled 2018-09-16 (×3): qty 1

## 2018-09-16 MED ORDER — ONDANSETRON HCL 4 MG/2ML IJ SOLN
4.0000 mg | Freq: Once | INTRAMUSCULAR | Status: AC
  Administered 2018-09-16: 4 mg via INTRAVENOUS
  Filled 2018-09-16: qty 2

## 2018-09-16 MED ORDER — KETOROLAC TROMETHAMINE 30 MG/ML IJ SOLN
30.0000 mg | Freq: Once | INTRAMUSCULAR | Status: AC
  Administered 2018-09-16: 30 mg via INTRAVENOUS
  Filled 2018-09-16: qty 1

## 2018-09-16 MED ORDER — LORAZEPAM 2 MG/ML IJ SOLN: 4.0000 mg | INTRAMUSCULAR | Status: DC | PRN

## 2018-09-16 MED ORDER — FENTANYL CITRATE (PF) 100 MCG/2ML IJ SOLN
50.0000 ug | Freq: Once | INTRAMUSCULAR | Status: AC
  Administered 2018-09-16: 50 ug via INTRAVENOUS
  Filled 2018-09-16: qty 2

## 2018-09-16 MED ORDER — IBUPROFEN 200 MG PO TABS
400.0000 mg | ORAL_TABLET | Freq: Four times a day (QID) | ORAL | Status: DC | PRN
  Administered 2018-09-16: 400 mg via ORAL
  Filled 2018-09-16: qty 2

## 2018-09-16 MED ORDER — ENOXAPARIN SODIUM 40 MG/0.4ML ~~LOC~~ SOLN
40.0000 mg | SUBCUTANEOUS | Status: DC
  Filled 2018-09-16 (×2): qty 0.4

## 2018-09-16 NOTE — ED Notes (Signed)
Bed: YI94 Expected date:  Expected time:  Means of arrival:  Comments: seizure
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
## 2018-09-16 NOTE — Progress Notes (Signed)
Called MD about pt and her last vitals and being on a non-monitored unit with her history.  MD stated pt stable to come and possible d/c tomorrow so no needs for monitored unit.

## 2018-09-16 NOTE — ED Notes (Signed)
Have stuck patient 5 times, lab keeps saying something is wrong with sample. Patient refused to be stuck again.

## 2018-09-16 NOTE — Progress Notes (Signed)
EEG was normal.   A/R: 28 year old female with breakthrough seizures -- Continue Keppra at increased dose of 1250 mg BID.  -- Continue Topamax at 100 mg BID.  -- Will need outpatient Neurology follow up.  -- Neurohospitalist service will sign off. Please call if there are additional questions.   Electronically signed: Dr. Caryl Pina

## 2018-09-16 NOTE — Progress Notes (Signed)
Pharmacy Brief Note:  Pharmacy consulted to dose LMWH for VTE prophylaxis.   Wt = 68 kg, CrCl 95 mL/min Hgb 12.2, Plt 176  Order enoxaparin 40 mg subQ daily. Pharmacy to sign off.   Cindi Carbon, PharmD 09/16/18 8:10 AM

## 2018-09-16 NOTE — ED Provider Notes (Addendum)
TIME SEEN: 2:25 AM  CHIEF COMPLAINT: Seizure  HPI: Patient is a 28 year old female with history of epilepsy on Keppra 1000 mg twice daily and Topamax 100 mg twice daily who presents to the emergency department today after multiple seizures.  Reports in the past 24 hours she has had 4 seizures.  States that she has [place] seizures and her significant other reports that they are her typical seizures.  She states she is having headache and neck pain but states she does not think she fell to the ground.  No numbness or weakness.  Reports compliance with her seizure medications.  States for the past week she has not been feeling well but no fevers, cough, vomiting, diarrhea, dysuria.  States she has had increased anxiety and stress recently.  ROS: See HPI Constitutional: no fever  Eyes: no drainage  ENT: no runny nose   Cardiovascular:  no chest pain  Resp: no SOB  GI: no vomiting GU: no dysuria Integumentary: no rash  Allergy: no hives  Musculoskeletal: no leg swelling  Neurological: no slurred speech ROS otherwise negative  PAST MEDICAL HISTORY/PAST SURGICAL HISTORY:  Past Medical History:  Diagnosis Date  . Abnormal Pap smear of cervix 12/2010   [facility] Dept-due for BX  . S/P endoscopy Aug 2012   mild gastritis  . Seizures (HCC)     MEDICATIONS:  Prior to Admission medications   Medication Sig Start Date End Date Taking? Authorizing Provider  busPIRone (BUSPAR) 10 MG tablet Take 10 mg by mouth 3 (three) times daily. 07/09/18  Yes [provider]  DULoxetine (CYMBALTA) 60 MG capsule Take 60 mg by mouth daily. 07/04/18  Yes [provider]  FERROUS SULFATE PO Take 1 tablet by mouth daily.   Yes [provider]  levETIRAcetam (KEPPRA) 500 MG tablet Take 2 tablets by mouth 2 (two) times daily. 03/07/15  Yes [provider]  Multiple Vitamin (MULTIVITAMIN) capsule Take 1 capsule by mouth every evening.    Yes [provider]   topiramate (TOPAMAX) 100 MG tablet Take 100 mg by mouth 2 (two) times daily. 07/04/18  Yes [provider]  HYDROcodone-acetaminophen (NORCO) 5-325 MG per tablet Take 1 or 2 po Q 6hrs for pain Patient not taking: Reported on 09/16/2018 11/25/11   Devoria Albe, MD  levETIRAcetam (KEPPRA) 500 MG tablet Take 1 tablet (500 mg total) by mouth every 12 (twelve) hours. Patient not taking: Reported on 09/16/2018 11/25/11 11/24/12  Devoria Albe, MD  Linaclotide 145 MCG CAPS Take 1 capsule by mouth daily as needed (constipation). Patient not taking: Reported on 09/16/2018 01/04/12   Joselyn Arrow, NP    ALLERGIES:  No Known Allergies  SOCIAL HISTORY:  Social History   Tobacco Use  . Smoking status: Never Smoker  Substance Use Topics  . Alcohol use: Yes    Comment: Rarely    FAMILY HISTORY: Family History  Problem Relation Age of Onset  . Hypertension Mother   . Colon cancer Neg Hx     EXAM: BP 118/80 (BP Location: Left Arm)   Pulse 90   Temp 98.9 F (37.2 C) (Oral)   Resp 16   Ht 5\' 8"  (1.727 m)   Wt 68 kg   SpO2 98%   BMI 22.81 kg/m  CONSTITUTIONAL: Alert and oriented and responds appropriately to questions. 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 reports is chronic, no new dental injury, no tongue laceration or abrasion; no septal hematoma NECK: Supple, no meningismus, no LAD; oddly tender over the mid cervical spine without step-off or deformity CARD: RRR; S1 and S2 appreciated; no murmurs, no clicks, no rubs, no gallops RESP: Normal chest excursion without splinting or tachypnea; breath sounds clear and equal bilaterally; no wheezes, no rhonchi, no rales; no hypoxia or respiratory distress CHEST:  chest wall stable, no crepitus or ecchymosis or deformity, nontender  to palpation; no flail chest ABD/GI: Normal bowel sounds; non-distended; soft, non-tender, no rebound, no guarding; no ecchymosis or other lesions noted PELVIS:  stable, nontender to palpation BACK:  The back appears normal and is non-tender to palpation, there is no CVA tenderness; no midline spinal tenderness, step-off or deformity EXT: Normal ROM in all joints; non-tender to palpation; no edema; normal capillary refill; no cyanosis, no bony tenderness or bony deformity of patient's extremities, no joint effusion, compartments are soft, extremities are warm and well-perfused, no ecchymosis SKIN: Normal color for age and race; warm NEURO: Moves all extremities equally, sensation to light touch intact diffusely, cranial nerves II through XII intact, normal speech PSYCH: The patient's mood and manner are appropriate. Grooming and personal hygiene are appropriate.  MEDICAL DECISION MAKING: Patient here after 4 seizures in the past 24 hours.  Reports that she normally goes several months between seizures.  Her neurologist is Dr. Clelia Croft in [place].  She reports compliance with her Keppra and Topamax.  No changes in medications recently.  No infectious symptoms.  Will obtain labs, urine.  She is currently on her menstrual cycle.  Will give pain and nausea medicine and load with IV Keppra.  Will obtain CT imaging of her head and cervical spine in the ED today.  ED PROGRESS: Labs unremarkable other than metabolic acidosis which may be from recent seizure activity.  Otherwise work-up unremarkable.  Patient very nervous about going home but I do not feel it is unreasonable to observe her in the hospital given for seizures in 24 hours.  Topamax level pending.  Discussed with Dr. Wilford Corner with neurology.  Appreciate his help.  He recommends increasing her Keppra to 1250 mg twice daily.  Agrees with observation for medical admission. Patient comfortable with this plan.  5:25 AM  Dr. Wilford Corner has come to the see patient  in the ED. Recommends EEG in the morning.  Patient still complaining of headache.  Will give Toradol, Reglan, Benadryl..   5:41 AM Discussed patient's case with hospitalist, Dr. [name].  I have recommended admission and patient (and family if present) agree with this plan. Admitting physician will place admission orders.   I reviewed all nursing notes, vitals, pertinent previous records, EKGs, lab and urine results, imaging (as available).    CRITICAL CARE Performed by: Rochele Raring   Total critical care time: 45 minutes  Critical care time was exclusive of separately billable procedures and treating other patients.  Critical care was necessary to treat or prevent imminent or life-threatening deterioration.  Critical care was time spent personally by me on the following activities: development of treatment plan with patient and/or surrogate as well as nursing, discussions with consultants, evaluation of patient's response to treatment, examination of patient, obtaining history from patient or surrogate, ordering and performing treatments and interventions, ordering and review of laboratory studies, ordering and review of radiographic studies, pulse oximetry and re-evaluation of patient's condition.  , Layla MawKristen N, DO 09/16/18 [phone]

## 2018-09-16 NOTE — ED Notes (Signed)
Bed: VU02 Expected date:  Expected time:  Means of arrival:  Comments: Hold for 19

## 2018-09-16 NOTE — Procedures (Signed)
ELECTROENCEPHALOGRAM REPORT   Patient: Rhonda Schroeder       Room #: PX10 EEG No. ID: [id number] Age: 28 y.o.        Sex: female Referring Physician: Renford Dills Report Date:  09/16/2018        Interpreting Physician: Thana Farr  History: [name] is an 28 y.o. female with a history of seizures presenting with breakthrough seizures  Medications:  None  Conditions of Recording:  This is a 21 channel routine scalp EEG performed with bipolar and monopolar montages arranged in accordance to the international 10/20 system of electrode placement. One channel was dedicated to EKG recording.  The patient is in the awake, drowsy and asleep states.  Description:  The waking background activity consists of a low voltage, symmetrical, fairly well organized, 10 Hz alpha activity, seen from the parieto-occipital and posterior temporal regions.  Low voltage fast activity, poorly organized, is seen anteriorly and is at times superimposed on more posterior regions.  A mixture of theta and alpha rhythms are seen from the central and temporal regions. The patient drowses with slowing to irregular, low voltage theta and beta activity.   The patient goes in to a light sleep with symmetrical sleep spindles, vertex central sharp transients and irregular slow activity.  No epileptiform activity is noted.   Hyperventilation and intermittent photic stimulation were not performed.   IMPRESSION: Normal electroencephalogram, awake and asleep. There are no focal lateralizing or epileptiform features.   Thana Farr, MD Neurology [phone] 09/16/2018, 10:52 AM

## 2018-09-16 NOTE — H&P (Signed)
[facility] History and Physical  Rhonda Schroeder QAE:[id number] DOB: [birth year] DOA: 09/16/2018  Referring physician: Dr Milon Dikes neurology PCP: System, Pcp Not In   Chief Complaint: Seizure  HPI: Rhonda Schroeder is a 28 y.o. WF PMHx epilepsy, seizures controlled (does not recall the last time she had a seizure before today).  Seizures triggered by stress.  States taking medication. On Keppra 1000 mg twice daily and Topamax 100 mg twice daily who presents to the emergency department today after multiple seizures.  Reports in the past 24 hours she has had 4 seizures.  States that she has [place] seizures and her significant other reports that they are her typical seizures.  She states she is having headache and neck pain but states she does not think she fell to the ground.  No numbness or weakness.  Reports compliance with her seizure medications.  States for the past week she has not been feeling well but no fevers, cough, vomiting, diarrhea, dysuria.  States she has had increased anxiety and stress recently.     Review of Systems:  Constitutional:  No weight loss, night sweats, Fevers, chills, fatigue.  HEENT:  Positive headaches, negative difficulty swallowing,Tooth/dental problems,Sore throat,  No sneezing, itching, ear ache, nasal congestion, post nasal drip,  Cardio-vascular:  No chest pain, Orthopnea, PND, swelling in lower extremities, anasarca, dizziness, palpitations  GI:  No heartburn, indigestion, abdominal pain, nausea, vomiting, diarrhea, change in bowel habits, loss of appetite  Resp:  No shortness of breath with exertion or at rest. No excess mucus, no productive cough, No non-productive cough, No coughing up of blood.No change in color of mucus.No wheezing.No chest wall deformity  Skin:  no rash or lesions.  GU:  no dysuria, change in color of urine, no urgency or frequency. No flank pain.  Musculoskeletal:  No joint pain or swelling. No decreased  range of motion. No back pain.  Psych:  No change in mood or affect. No depression or anxiety. No memory loss.   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   S/P endoscopy Aug 2012   mild gastritis   Seizure (HCC) 09/16/2018   Seizures (HCC)    Past Surgical History:  Procedure Laterality Date   CHOLECYSTECTOMY  August 2012   COLONOSCOPY  08/07/2011   Internal hemorrhoids/ Nl colonoscopy [facility]   ESOPHAGOGASTRODUODENOSCOPY  02/16/2011   Procedure: ESOPHAGOGASTRODUODENOSCOPY (EGD);  Surgeon: Arlyce Harman, MD;  Location: [facility];  Service: Endoscopy;  Laterality: N/A;   extraction of wisdom teeth     FLEXIBLE SIGMOIDOSCOPY  02/16/2011   Procedure: FLEXIBLE SIGMOIDOSCOPY;  Surgeon: Arlyce Harman, MD;  Location: [facility];  Service: Endoscopy;  Laterality: N/A;   Social History:  reports that she has never smoked. She does not have any smokeless tobacco history on file. She reports current alcohol use. She reports that she does not use drugs.  No Known Allergies  Family History  Problem Relation Age of Onset   Hypertension Mother    Colon cancer Neg Hx     Prior to Admission medications   Medication Sig Start Date End Date Taking? Authorizing Provider  busPIRone (BUSPAR) 10 MG tablet Take 10 mg by mouth 3 (three) times daily. 07/09/18  Yes [provider]  DULoxetine (CYMBALTA) 60 MG capsule Take 60 mg by mouth daily. 07/04/18  Yes  [provider]  FERROUS SULFATE PO Take 1 tablet by mouth daily.   Yes [provider]  levETIRAcetam (KEPPRA) 500 MG tablet Take 2 tablets by mouth 2 (two) times daily. 03/07/15  Yes [provider]  Multiple Vitamin (MULTIVITAMIN) capsule Take 1 capsule by mouth every evening.    Yes [provider]  topiramate (TOPAMAX) 100 MG tablet Take 100 mg by mouth 2 (two) times  daily. 07/04/18  Yes [provider]  HYDROcodone-acetaminophen (NORCO) 5-325 MG per tablet Take 1 or 2 po Q 6hrs for pain Patient not taking: Reported on 09/16/2018 11/25/11   Devoria Albe, MD  levETIRAcetam (KEPPRA) 500 MG tablet Take 1 tablet (500 mg total) by mouth every 12 (twelve) hours. Patient not taking: Reported on 09/16/2018 11/25/11 11/24/12  Devoria Albe, MD  Linaclotide 145 MCG CAPS Take 1 capsule by mouth daily as needed (constipation). Patient not taking: Reported on 09/16/2018 01/04/12   Joselyn Arrow, NP     Consultants:  Dr Milon Dikes neurology    Procedures/Significant Events:  3/13 CT C-spine: WNL 3/13 CT head; WNL    I have personally reviewed and interpreted all radiology studies and my findings are as above.   VENTILATOR SETTINGS:    Cultures   Antimicrobials:    Devices    LINES / TUBES:      Continuous Infusions:  Physical Exam: Vitals:   09/16/18 0136 09/16/18 0345 09/16/18 0515 09/16/18 0700  BP: 118/80 107/62 103/68 106/61  Pulse: 90 (!) 58  60  Resp: Temp: 98.9 F (37.2 C)     TempSrc: Oral     SpO2: 98% 100%    Weight: 68 kg     Height:  (1.727 m)       Wt Readings from Last 3 Encounters:  09/16/18 68 kg  11/29/11 78.4 kg  11/26/11 78.9 kg    General: No acute respiratory distress Eyes: negative scleral hemorrhage, negative anisocoria, negative icterus, pupils dilated ENT: Negative Runny nose, negative gingival bleeding, Neck:  Negative scars, masses, torticollis, lymphadenopathy, JVD Lungs: Clear to auscultation bilaterally without wheezes or crackles Cardiovascular: Regular rate and rhythm without murmur gallop or rub normal S1 and S2 Abdomen: negative abdominal pain, nondistended, positive soft, bowel sounds, no rebound, no ascites, no appreciable mass Extremities: No significant cyanosis, clubbing, or edema bilateral lower extremities Skin: Negative rashes, lesions, ulcers Psychiatric:   Negative depression, negative anxiety, negative fatigue, negative mania, mild confusion secondary to seizure medication. Central nervous system:  Cranial nerves II through XII intact, tongue/uvula midline, all extremities muscle strength 5/5, sensation intact throughout, finger nose finger bilateral within normal limits,  negative dysarthria, negative expressive aphasia, negative receptive aphasia.        Labs on Admission:  Basic Metabolic Panel: Recent Labs  Lab 09/16/18 0346 09/16/18 0623  NA 143  --   K 3.8  --   CL 116*  --   CO2 17*  --   GLUCOSE 85  --   BUN 11  --   CREATININE 0.87  --   CALCIUM 8.6*  --   MG  --  1.9  PHOS  --  3.8   Liver Function Tests: Recent Labs  Lab 09/16/18 0346  AST 22  ALT 12  ALKPHOS 41  BILITOT 0.4  PROT 6.2*  ALBUMIN 3.8   No results for input(s): LIPASE, AMYLASE in the last 168 hours. No results for input(s): AMMONIA in the last 168  hours. CBC: Recent Labs  Lab 09/16/18 0256  WBC 5.0  NEUTROABS 2.3  HGB 12.2  HCT 37.1  MCV 93.7  PLT 176   Cardiac Enzymes: No results for input(s): CKTOTAL, CKMB, CKMBINDEX, TROPONINI in the last 168 hours.  BNP (last 3 results) No results for input(s): BNP in the last 8760 hours.  ProBNP (last 3 results) No results for input(s): PROBNP in the last 8760 hours.  CBG: Recent Labs  Lab 09/16/18 0319  GLUCAP 81    Radiological Exams on Admission: Ct Head Wo Contrast  Result Date: 09/16/2018 CLINICAL DATA:  Three seizures this evening lasting 5-7 minutes each. Occipital headache. EXAM: CT HEAD WITHOUT CONTRAST CT CERVICAL SPINE WITHOUT CONTRAST TECHNIQUE: Multidetector CT imaging of the head and cervical spine was performed following the standard protocol without intravenous contrast. Multiplanar CT image reconstructions of the cervical spine were also generated. COMPARISON:  11/25/2011 head CT and cervical spine radiographs 11/26/2011 FINDINGS: CT HEAD FINDINGS Brain: No evidence of  acute infarction, hemorrhage, hydrocephalus, extra-axial collection or mass lesion/mass effect. Vascular: No hyperdense vessel or unexpected calcification. Skull: Normal. Negative for fracture or focal lesion. Sinuses/Orbits: No acute finding. Other: None. CT CERVICAL SPINE FINDINGS Alignment: Normal. Skull base and vertebrae: No acute fracture. No primary bone lesion or focal pathologic process. Soft tissues and spinal canal: No prevertebral fluid or swelling. No visible canal hematoma. Disc levels: Maintained without significant central or foraminal stenosis. No jumped or perched facets. Upper chest: Negative Other: None IMPRESSION: 1. Normal head CT. 2. No acute cervical spine fracture or static listhesis. Electronically Signed   By: Tollie Ethavid  Kwon M.D.   On: 09/16/2018 03:38   Ct Cervical Spine Wo Contrast  Result Date: 09/16/2018 CLINICAL DATA:  Three seizures this evening lasting 5-7 minutes each. Occipital headache. EXAM: CT HEAD WITHOUT CONTRAST CT CERVICAL SPINE WITHOUT CONTRAST TECHNIQUE: Multidetector CT imaging of the head and cervical spine was performed following the standard protocol without intravenous contrast. Multiplanar CT image reconstructions of the cervical spine were also generated. COMPARISON:  11/25/2011 head CT and cervical spine radiographs 11/26/2011 FINDINGS: CT HEAD FINDINGS Brain: No evidence of acute infarction, hemorrhage, hydrocephalus, extra-axial collection or mass lesion/mass effect. Vascular: No hyperdense vessel or unexpected calcification. Skull: Normal. Negative for fracture or focal lesion. Sinuses/Orbits: No acute finding. Other: None. CT CERVICAL SPINE FINDINGS Alignment: Normal. Skull base and vertebrae: No acute fracture. No primary bone lesion or focal pathologic process. Soft tissues and spinal canal: No prevertebral fluid or swelling. No visible canal hematoma. Disc levels: Maintained without significant central or foraminal stenosis. No jumped or perched facets.  Upper chest: Negative Other: None IMPRESSION: 1. Normal head CT. 2. No acute cervical spine fracture or static listhesis. Electronically Signed   By: Tollie Ethavid  Kwon M.D.   On: 09/16/2018 03:38    EKG:     Assessment/Plan Active Problems:   Anemia   IBS (irritable bowel syndrome)   Seizure (HCC)   Pseudoseizure   Migraine  Seizures/pseudoseizure -EtOH level<10 -Urine tox screen pending -Rapid pregnancy test< 5.0 -Per neurology increase Keppra to 1250 mg twice daily -Continue home dose Topamax -EEG pending - Seizure precautions - Arrange outpatient neurology follow-up in 2 to 4 weeks post discharge -Consider repeating MRI as outpatient  Migraine headache - Manage headache with Toradol/Benadryl/Reglan - Avoid opiates as they can cause rebound/overuse headaches  IBS -Currently not an issue    Code Status: Full (DVT Prophylaxis: Lovenox Family Communication: None Disposition Plan: Per neurology   Data Reviewed:  Care during the described time interval was provided by me .  I have reviewed this patient's available data, including medical history, events of note, physical examination, and all test results as part of my evaluation.   Time spent: 60 min  Rhonda Schroeder, Roselind Messier [facility] Pager [phone]

## 2018-09-16 NOTE — Progress Notes (Signed)
Call ED for report at 1713 and no answer.  Per RN who gave report, was not notified of bed number so was unaware that needed to call report.

## 2018-09-16 NOTE — ED Triage Notes (Signed)
Pt BIB [facility]. Pt from home. PT had 3 total seizures tonight pt post ictal upon EMS arrival. CAOx4 currently. Hx of epilepsy. No changes to the meds and pt is complaints. Pt is going through some stressful life events this week. Seizures lasted anywhere from 5-7 min. No trauma from seizures noted.

## 2018-09-16 NOTE — Progress Notes (Signed)
Patient is a 28 year old female with history of epilepsy who presenteed to the emergency department with episode of seizure at home.  She continues to take her medications Keppra and Topamax at home.  Neurology consulted.  CT head did not show any acute intracranial abnormalities.  Neurology recommended to increase the Keppra to 1250 mg twice daily. EEG done here did not show any epileptic activity.  Patient is currently hemodynamically stable. Plan for discharge tomorrow.  She will follow-up with her own neurologist as an outpatient.  Consider  MRI as an outpatient . Patient seen by Dr.Woods this morning.

## 2018-09-16 NOTE — Progress Notes (Signed)
EEG Completed; Results Pending  

## 2018-09-17 MED ORDER — LEVETIRACETAM 250 MG PO TABS: 1250.0000 mg | ORAL_TABLET | Freq: Two times a day (BID) | ORAL | 0 refills | Status: DC

## 2018-09-17 MED ORDER — IBUPROFEN 400 MG PO TABS: 400.0000 mg | ORAL_TABLET | Freq: Three times a day (TID) | ORAL | 0 refills | Status: DC | PRN

## 2018-09-17 NOTE — Discharge Summary (Signed)
Physician Discharge Summary  Rhonda Schroeder:[id number] DOB: Jun 09, 1991 DOA: 09/16/2018  PCP: System, Pcp Not In  Admit date: 09/16/2018 Discharge date: 09/17/2018  Admitted From: Home Disposition:  Home  Discharge Condition:Stable CODE STATUS:FULL Diet recommendation: Heart Healthy  Brief/Interim Summary: Patient is a 27 year old female with history of epilepsy who presenteed to the emergency department with episode of seizure at home.  She continues to take her medications Keppra and Topamax at home.  Neurology consulted.  CT head did not show any acute intracranial abnormalities.  Neurology recommended to increase the Keppra to 1250 mg twice daily. EEG done here did not show any epileptic activity.  Patient is currently hemodynamically stable. She is stable for discharge to home today.  Following problems were addressed during hospitalization:   Seizures/pseudoseizure -Per neurology increase Keppra to 1250 mg twice daily -Continue home dose Topamax -EEG did not show seizure activity - Seizure precautions - Arrange outpatient neurology follow-up in 2 to 4 weeks post discharge -Consider repeating MRI as outpatient  Migraine headache - Continue NSAIDs - Avoid opiates as they can cause rebound/overuse headaches -Follow up with neurology  IBS -Currently not an issue  Anxiety/depression -Follow-up with psychiatry as an outpatient  Discharge Diagnoses:  Active Problems:   Anemia   IBS (irritable bowel syndrome)   Seizure (HCC)   Pseudoseizure   Migraine    Discharge Instructions  Discharge Instructions    Diet - low sodium heart healthy   Complete by:  As directed    Discharge instructions   Complete by:  As directed    1)Follow up with your neurologist as an outpatient in 2 weeks. 2)Take prescribed medications as instructed.   Increase activity slowly   Complete by:  As directed      Allergies as of 09/17/2018   No Known Allergies     Medication  List    STOP taking these medications   HYDROcodone-acetaminophen 5-325 MG tablet Commonly known as:  NORCO/VICODIN   linaclotide 145 MCG Caps capsule Commonly known as:  LINZESS     TAKE these medications   busPIRone 10 MG tablet Commonly known as:  BUSPAR Take 10 mg by mouth 3 (three) times daily.   DULoxetine 60 MG capsule Commonly known as:  CYMBALTA Take 60 mg by mouth daily.   FERROUS SULFATE PO Take 1 tablet by mouth daily.   ibuprofen 400 MG tablet Commonly known as:  ADVIL,MOTRIN Take 1 tablet (400 mg total) by mouth every 8 (eight) hours as needed.   levETIRAcetam 250 MG tablet Commonly known as:  KEPPRA Take 5 tablets (1,250 mg total) by mouth 2 (two) times daily for 30 days. What changed:    medication strength  how much to take  when to take this  Another medication with the same name was removed. Continue taking this medication, and follow the directions you see here.   multivitamin capsule Take 1 capsule by mouth every evening.   topiramate 100 MG tablet Commonly known as:  TOPAMAX Take 100 mg by mouth 2 (two) times daily.       No Known Allergies  Consultations:  Neurology   Procedures/Studies: Ct Head Wo Contrast  Result Date: 09/16/2018 CLINICAL DATA:  Three seizures this evening lasting 5-7 minutes each. Occipital headache. EXAM: CT HEAD WITHOUT CONTRAST CT CERVICAL SPINE WITHOUT CONTRAST TECHNIQUE: Multidetector CT imaging of the head and cervical spine was performed following the standard protocol without intravenous contrast. Multiplanar CT image reconstructions of the cervical spine were also  generated. COMPARISON:  11/25/2011 head CT and cervical spine radiographs 11/26/2011 FINDINGS: CT HEAD FINDINGS Brain: No evidence of acute infarction, hemorrhage, hydrocephalus, extra-axial collection or mass lesion/mass effect. Vascular: No hyperdense vessel or unexpected calcification. Skull: Normal. Negative for fracture or focal lesion.  Sinuses/Orbits: No acute finding. Other: None. CT CERVICAL SPINE FINDINGS Alignment: Normal. Skull base and vertebrae: No acute fracture. No primary bone lesion or focal pathologic process. Soft tissues and spinal canal: No prevertebral fluid or swelling. No visible canal hematoma. Disc levels: Maintained without significant central or foraminal stenosis. No jumped or perched facets. Upper chest: Negative Other: None IMPRESSION: 1. Normal head CT. 2. No acute cervical spine fracture or static listhesis. Electronically Signed   By: Tollie Eth M.D.   On: 09/16/2018 03:38   Ct Cervical Spine Wo Contrast  Result Date: 09/16/2018 CLINICAL DATA:  Three seizures this evening lasting 5-7 minutes each. Occipital headache. EXAM: CT HEAD WITHOUT CONTRAST CT CERVICAL SPINE WITHOUT CONTRAST TECHNIQUE: Multidetector CT imaging of the head and cervical spine was performed following the standard protocol without intravenous contrast. Multiplanar CT image reconstructions of the cervical spine were also generated. COMPARISON:  11/25/2011 head CT and cervical spine radiographs 11/26/2011 FINDINGS: CT HEAD FINDINGS Brain: No evidence of acute infarction, hemorrhage, hydrocephalus, extra-axial collection or mass lesion/mass effect. Vascular: No hyperdense vessel or unexpected calcification. Skull: Normal. Negative for fracture or focal lesion. Sinuses/Orbits: No acute finding. Other: None. CT CERVICAL SPINE FINDINGS Alignment: Normal. Skull base and vertebrae: No acute fracture. No primary bone lesion or focal pathologic process. Soft tissues and spinal canal: No prevertebral fluid or swelling. No visible canal hematoma. Disc levels: Maintained without significant central or foraminal stenosis. No jumped or perched facets. Upper chest: Negative Other: None IMPRESSION: 1. Normal head CT. 2. No acute cervical spine fracture or static listhesis. Electronically Signed   By: Tollie Eth M.D.   On: 09/16/2018 03:38        Subjective: Patient seen and examined the bedside this morning.  Hemodynamically stable for discharge today.  Discharge Exam: Vitals:   09/17/18 0605 09/17/18 0701  BP: (!) 90/45 (!) 101/43  Pulse: 66 63  Resp: 16   Temp: 97.9 F (36.6 C)   SpO2: 98%    Vitals:   09/16/18 1807 09/16/18 2250 09/17/18 0605 09/17/18 0701  BP: (!) 115/55 (!) 102/51 (!) 90/45 (!) 101/43  Pulse: 64 (!) 54 66 63  Resp: 16 16 16    Temp: 98.1 F (36.7 C) (!) 97.4 F (36.3 C) 97.9 F (36.6 C)   TempSrc: Oral Oral Oral   SpO2: 100% 100% 98%   Weight:      Height:        General: Pt is alert, awake, not in acute distress Cardiovascular: RRR, S1/S2 +, no rubs, no gallops Respiratory: CTA bilaterally, no wheezing, no rhonchi Abdominal: Soft, NT, ND, bowel sounds + Extremities: no edema, no cyanosis    The results of significant diagnostics from this hospitalization (including imaging, microbiology, ancillary and laboratory) are listed below for reference.     Microbiology: No results found for this or any previous visit (from the past 240 hour(s)).   Labs: BNP (last 3 results) No results for input(s): BNP in the last 8760 hours. Basic Metabolic Panel: Recent Labs  Lab 09/16/18 0346 09/16/18 0623  NA 143  --   K 3.8  --   CL 116*  --   CO2 17*  --   GLUCOSE 85  --  BUN 11  --   CREATININE 0.87  --   CALCIUM 8.6*  --   MG  --  1.9  PHOS  --  3.8   Liver Function Tests: Recent Labs  Lab 09/16/18 0346  AST 22  ALT 12  ALKPHOS 41  BILITOT 0.4  PROT 6.2*  ALBUMIN 3.8   No results for input(s): LIPASE, AMYLASE in the last 168 hours. No results for input(s): AMMONIA in the last 168 hours. CBC: Recent Labs  Lab 09/16/18 0256  WBC 5.0  NEUTROABS 2.3  HGB 12.2  HCT 37.1  MCV 93.7  PLT 176   Cardiac Enzymes: No results for input(s): CKTOTAL, CKMB, CKMBINDEX, TROPONINI in the last 168 hours. BNP: Invalid input(s): POCBNP CBG: Recent Labs  Lab 09/16/18 0319   GLUCAP 81   D-Dimer No results for input(s): DDIMER in the last 72 hours. Hgb A1c No results for input(s): HGBA1C in the last 72 hours. Lipid Profile No results for input(s): CHOL, HDL, LDLCALC, TRIG, CHOLHDL, LDLDIRECT in the last 72 hours. Thyroid function studies No results for input(s): TSH, T4TOTAL, T3FREE, THYROIDAB in the last 72 hours.  Invalid input(s): FREET3 Anemia work up No results for input(s): VITAMINB12, FOLATE, FERRITIN, TIBC, IRON, RETICCTPCT in the last 72 hours. Urinalysis    Component Value Date/Time   COLORURINE STRAW (A) 11/24/2011 2113   APPEARANCEUR CLEAR 11/24/2011 2113   LABSPEC <1.005 (L) 11/24/2011 2113   PHURINE 6.5 11/24/2011 2113   GLUCOSEU NEGATIVE 11/24/2011 2113   HGBUR MODERATE (A) 11/24/2011 2113   BILIRUBINUR NEGATIVE 11/24/2011 2113   KETONESUR NEGATIVE 11/24/2011 2113   PROTEINUR NEGATIVE 11/24/2011 2113   UROBILINOGEN 0.2 11/24/2011 2113   NITRITE NEGATIVE 11/24/2011 2113   LEUKOCYTESUR NEGATIVE 11/24/2011 2113   Sepsis Labs Invalid input(s): PROCALCITONIN,  WBC,  LACTICIDVEN Microbiology No results found for this or any previous visit (from the past 240 hour(s)).  Please note: You were cared for by a hospitalist during your hospital stay. Once you are discharged, your primary care physician will handle any further medical issues. Please note that NO REFILLS for any discharge medications will be authorized once you are discharged, as it is imperative that you return to your primary care physician (or establish a relationship with a primary care physician if you do not have one) for your post hospital discharge needs so that they can reassess your need for medications and monitor your lab values.    Time coordinating discharge: 40 minutes  SIGNED:   Burnadette Pop, MD  [facility] 09/17/2018, 10:28 AM Pager [phone]  If 7PM-7AM, please contact night-coverage [email or web address] Password [email or web address]

## 2018-09-17 NOTE — Progress Notes (Addendum)
Reviewed discharge paperwork, medication regimen, and follow up appointments as well as where to pick up prescriptions with patient. Patient declined wheelchair escort, walked down with husband.

## 2018-09-19 LAB — TOPIRAMATE LEVEL: Topiramate Lvl: 4.5 ug/mL (ref 2.0–25.0)

## 2018-10-05 ENCOUNTER — Other Ambulatory Visit: Payer: Self-pay | Admitting: Neurology

## 2018-10-05 DIAGNOSIS — R569 Unspecified convulsions: Secondary | ICD-10-CM

## 2018-11-23 ENCOUNTER — Ambulatory Visit
Admission: RE | Admit: 2018-11-23 | Discharge: 2018-11-23 | Disposition: A | Discharge status: Home / Self Care | Payer: Medicaid Other | Source: Ambulatory Visit | Attending: Neurology | Admitting: Neurology

## 2018-11-23 ENCOUNTER — Other Ambulatory Visit: Payer: Self-pay

## 2018-11-23 DIAGNOSIS — R569 Unspecified convulsions: Secondary | ICD-10-CM | POA: Diagnosis present

## 2018-11-23 MED ORDER — GADOBUTROL 1 MMOL/ML IV SOLN
6.0000 mL | Freq: Once | INTRAVENOUS | Status: AC | PRN
  Administered 2018-11-23: 6 mL via INTRAVENOUS

## 2019-07-17 ENCOUNTER — Ambulatory Visit: Payer: Medicaid Other | Attending: Internal Medicine

## 2019-07-17 DIAGNOSIS — Z20822 Contact with and (suspected) exposure to covid-19: Secondary | ICD-10-CM

## 2019-07-18 ENCOUNTER — Other Ambulatory Visit: Payer: Medicaid Other

## 2019-07-18 LAB — NOVEL CORONAVIRUS, NAA: SARS-CoV-2, NAA: NOT DETECTED

## 2019-08-03 ENCOUNTER — Encounter: Payer: Self-pay | Admitting: Neurology

## 2019-10-16 ENCOUNTER — Other Ambulatory Visit: Payer: Self-pay | Admitting: Neurology

## 2019-10-16 ENCOUNTER — Telehealth: Payer: Self-pay | Admitting: Neurology

## 2019-10-16 ENCOUNTER — Ambulatory Visit: Payer: Medicaid Other | Admitting: Neurology

## 2019-10-16 ENCOUNTER — Encounter: Payer: Self-pay | Admitting: Neurology

## 2019-10-16 ENCOUNTER — Other Ambulatory Visit: Payer: Self-pay

## 2019-10-16 VITALS — BP 122/77 | HR 66 | Ht 68.0 in | Wt 165.0 lb

## 2019-10-16 DIAGNOSIS — F445 Conversion disorder with seizures or convulsions: Secondary | ICD-10-CM | POA: Diagnosis not present

## 2019-10-16 DIAGNOSIS — G43709 Chronic migraine without aura, not intractable, without status migrainosus: Secondary | ICD-10-CM

## 2019-10-16 DIAGNOSIS — G40309 Generalized idiopathic epilepsy and epileptic syndromes, not intractable, without status epilepticus: Secondary | ICD-10-CM

## 2019-10-16 DIAGNOSIS — IMO0002 Reserved for concepts with insufficient information to code with codable children: Secondary | ICD-10-CM

## 2019-10-16 MED ORDER — EMGALITY 120 MG/ML ~~LOC~~ SOSY: 1.0000 | PREFILLED_SYRINGE | SUBCUTANEOUS | 11 refills | Status: DC

## 2019-10-16 MED ORDER — NORTRIPTYLINE HCL 10 MG PO CAPS: ORAL_CAPSULE | ORAL | 11 refills | Status: DC

## 2019-10-16 MED ORDER — TOPIRAMATE 100 MG PO TABS: 100.0000 mg | ORAL_TABLET | Freq: Two times a day (BID) | ORAL | 3 refills | Status: DC

## 2019-10-16 MED ORDER — LEVETIRACETAM 1000 MG PO TABS: 1000.0000 mg | ORAL_TABLET | Freq: Two times a day (BID) | ORAL | 3 refills | Status: DC

## 2019-10-16 NOTE — Patient Instructions (Signed)
1. Start weaning off Lamictal: take 1 tablet twice a day for 1 week, then 1 tablet every night for a week, then stop  2. Increase Levetiracetam 250mg : take 2 tablets twice a day for a week, then you will have a new prescription for Levetiracetam 1000mg : take 1 tablet twice a day  3. Start nortriptyline 10mg : Take 1 capsule every night for 1 week, then increase to 2 capsules every night and continue  4. Start reducing Aleve intake to 2-3 times a week, otherwise headaches will be harder to treat  5. Continue Topamax 100mg  twice a day  6. Refer to Psychiatry to anxiety. Continue working with therapist  7. Follow-up in 3-4 months, call for any changes  Seizure Precautions: 1. If medication has been prescribed for you to prevent seizures, take it exactly as directed.  Do not stop taking the medicine without talking to your doctor first, even if you have not had a seizure in a long time.   2. Avoid activities in which a seizure would cause danger to yourself or to others.  Don't operate dangerous machinery, swim alone, or climb in high or dangerous places, such as on ladders, roofs, or girders.  Do not drive unless your doctor says you may.  3. If you have any warning that you may have a seizure, lay down in a safe place where you can't hurt yourself.    4.  No driving for 6 months from last seizure, as per [facility].   Please refer to the following link on the [employer]'s website for more information: http://www.epilepsyfoundation.org/answerplace/Social/driving/drivingu.cfm   5.  Maintain good sleep hygiene. Avoid alcohol  6.  Notify your neurology if you are planning pregnancy or if you become pregnant.  7.  Contact your doctor if you have any problems that may be related to the medicine you are taking.  8.  Call 911 and bring the patient back to the ED if:        A.  The seizure lasts longer than 5 minutes.       B.  The patient doesn't awaken shortly  after the seizure  C.  The patient has new problems such as difficulty seeing, speaking or moving  D.  The patient was injured during the seizure  E.  The patient has a temperature over 102 F (39C)  F.  The patient vomited and now is having trouble breathing
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
## 2019-10-16 NOTE — Telephone Encounter (Signed)
Pt got married and changed her name on everything but insurance so we need to recall in the medication under the patient old name which is Rhonda Schroeder

## 2019-11-17 ENCOUNTER — Other Ambulatory Visit: Payer: Self-pay

## 2019-11-17 ENCOUNTER — Telehealth (INDEPENDENT_AMBULATORY_CARE_PROVIDER_SITE_OTHER): Payer: Medicaid Other | Admitting: Psychiatry

## 2019-11-17 ENCOUNTER — Encounter (HOSPITAL_COMMUNITY): Payer: Self-pay | Admitting: Psychiatry

## 2019-11-17 DIAGNOSIS — F4310 Post-traumatic stress disorder, unspecified: Secondary | ICD-10-CM

## 2019-11-17 DIAGNOSIS — F319 Bipolar disorder, unspecified: Secondary | ICD-10-CM | POA: Diagnosis not present

## 2019-11-17 MED ORDER — NORTRIPTYLINE HCL 25 MG PO CAPS: ORAL_CAPSULE | ORAL | 1 refills | Status: DC

## 2019-11-17 MED ORDER — ARIPIPRAZOLE 2 MG PO TABS: 2.0000 mg | ORAL_TABLET | Freq: Every day | ORAL | 1 refills | Status: DC
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
## 2019-12-08 ENCOUNTER — Other Ambulatory Visit: Payer: Self-pay

## 2019-12-08 ENCOUNTER — Telehealth (INDEPENDENT_AMBULATORY_CARE_PROVIDER_SITE_OTHER): Payer: Medicaid Other | Admitting: Psychiatry

## 2019-12-08 ENCOUNTER — Encounter (HOSPITAL_COMMUNITY): Payer: Self-pay | Admitting: Psychiatry

## 2019-12-08 VITALS — Wt 160.0 lb

## 2019-12-08 DIAGNOSIS — F319 Bipolar disorder, unspecified: Secondary | ICD-10-CM | POA: Diagnosis not present

## 2019-12-08 DIAGNOSIS — F4310 Post-traumatic stress disorder, unspecified: Secondary | ICD-10-CM

## 2019-12-08 MED ORDER — ARIPIPRAZOLE (SENSOR) 5 MG PO TABS: 5.0000 mg | ORAL_TABLET | Freq: Every day | ORAL | 1 refills | Status: DC

## 2019-12-08 MED ORDER — NORTRIPTYLINE HCL 50 MG PO CAPS: 50.0000 mg | ORAL_CAPSULE | Freq: Every day | ORAL | 1 refills | Status: DC
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
## 2019-12-14 ENCOUNTER — Other Ambulatory Visit (HOSPITAL_COMMUNITY): Payer: Self-pay | Admitting: Psychiatry

## 2019-12-14 DIAGNOSIS — F4310 Post-traumatic stress disorder, unspecified: Secondary | ICD-10-CM

## 2019-12-19 ENCOUNTER — Telehealth: Payer: Self-pay

## 2019-12-19 NOTE — Telephone Encounter (Signed)
Pt called no answer voice mail left for pt to call back.   When pt calls back needs to be informed that when Dr Karel Jarvis was filling out her DMV forms, but pls let her know that they won't let her drive anyway because she reported to me a blacking out episode last April which I have to write down on her DMV forms. Any episode of loss of consciousness from whatever reason needs to be reported

## 2019-12-20 ENCOUNTER — Telehealth: Payer: Self-pay

## 2019-12-20 NOTE — Telephone Encounter (Signed)
Close encounter 

## 2019-12-25 ENCOUNTER — Telehealth: Payer: Self-pay | Admitting: Neurology

## 2019-12-25 ENCOUNTER — Other Ambulatory Visit (HOSPITAL_COMMUNITY): Payer: Self-pay | Admitting: Psychiatry

## 2019-12-25 DIAGNOSIS — F4310 Post-traumatic stress disorder, unspecified: Secondary | ICD-10-CM

## 2019-12-25 NOTE — Telephone Encounter (Signed)
Was there a lot of stress going on? Did this feel like the stress seizures she was having? Would monitor for now, and if any changes, we may increase the Topamax, if she is agreeable. PPls let her know I have her DMV forms, but if she is having seizures, they will not let her drive. Did she still want me to fill this or wait? Thanks

## 2019-12-25 NOTE — Telephone Encounter (Signed)
Pt called no answer voice mail left for pt to call back 

## 2019-12-25 NOTE — Telephone Encounter (Signed)
Patient called to notify Dr. Karel Jarvis she had a seizure Thursday night that lasted around 10 seconds, states she could feel it coming on but doesn't know what triggered it. Was out in a public place eating dinner when it came on.

## 2019-12-26 NOTE — Telephone Encounter (Signed)
Pt called back no answer voice mail left for her to call the office   

## 2019-12-27 ENCOUNTER — Telehealth: Payer: Self-pay | Admitting: Neurology

## 2019-12-27 NOTE — Telephone Encounter (Signed)
Access[facility]urse 12/26/19 @ 4:45pm:  "Caller states she is returning a call from [employer]. Please call."  Please see other closed note.

## 2019-12-28 NOTE — Telephone Encounter (Signed)
My chart message sent to pt.

## 2020-01-09 ENCOUNTER — Other Ambulatory Visit: Payer: Self-pay | Admitting: Neurology

## 2020-01-09 ENCOUNTER — Other Ambulatory Visit: Payer: Self-pay

## 2020-01-09 MED ORDER — TOPIRAMATE 100 MG PO TABS: 100.0000 mg | ORAL_TABLET | Freq: Two times a day (BID) | ORAL | 0 refills | Status: DC

## 2020-01-09 NOTE — Telephone Encounter (Signed)
Refill sent to pharmacy on file

## 2020-01-09 NOTE — Telephone Encounter (Signed)
[facility] 01/09/20 @ 12:00pm:  "Caller states she needs her prescription Topiramate 100mg  to be refilled. Pharmacy states she doesn't have any refills until September but patient states that she has taken medication correct and she will be out by Monday. Caller also wants to make sure her married name is in their system for her insurance purposes. She uses [facility] in [place].  Caller is very worried because she says this is her seizure medication."

## 2020-01-19 ENCOUNTER — Encounter: Payer: Self-pay | Admitting: Neurology

## 2020-01-19 ENCOUNTER — Ambulatory Visit (INDEPENDENT_AMBULATORY_CARE_PROVIDER_SITE_OTHER): Payer: Medicaid Other | Admitting: Neurology

## 2020-01-19 ENCOUNTER — Other Ambulatory Visit: Payer: Self-pay

## 2020-01-19 VITALS — BP 117/79 | HR 76 | Ht 68.0 in | Wt 159.6 lb

## 2020-01-19 DIAGNOSIS — G40309 Generalized idiopathic epilepsy and epileptic syndromes, not intractable, without status epilepticus: Secondary | ICD-10-CM

## 2020-01-19 DIAGNOSIS — F445 Conversion disorder with seizures or convulsions: Secondary | ICD-10-CM

## 2020-01-19 DIAGNOSIS — IMO0002 Reserved for concepts with insufficient information to code with codable children: Secondary | ICD-10-CM

## 2020-01-19 DIAGNOSIS — G43709 Chronic migraine without aura, not intractable, without status migrainosus: Secondary | ICD-10-CM

## 2020-01-19 MED ORDER — TOPIRAMATE 100 MG PO TABS: ORAL_TABLET | ORAL | 3 refills | Status: DC

## 2020-01-19 NOTE — Patient Instructions (Signed)
Good to see you!  1. Increase Topamax 100mg : take 1 and 1/2 tablets twice a day  2. Continue Keppra 1000mg  twice a day  3. Continue follow-up with Behavioral Health  4. Follow-up in 4-5 months, call for any changes  Seizure Precautions: 1. If medication has been prescribed for you to prevent seizures, take it exactly as directed.  Do not stop taking the medicine without talking to your doctor first, even if you have not had a seizure in a long time.   2. Avoid activities in which a seizure would cause danger to yourself or to others.  Don't operate dangerous machinery, swim alone, or climb in high or dangerous places, such as on ladders, roofs, or girders.  Do not drive unless your doctor says you may.  3. If you have any warning that you may have a seizure, lay down in a safe place where you can't hurt yourself.    4.  No driving for 6 months from last seizure, as per [facility].   Please refer to the following link on the Epilepsy Foundation of America's website for more information: http://www.epilepsyfoundation.org/answerplace/Social/driving/drivingu.cfm   5.  Maintain good sleep hygiene. Avoid alcohol  6.  Notify your neurology if you are planning pregnancy or if you become pregnant.  7.  Contact your doctor if you have any problems that may be related to the medicine you are taking.  8.  Call 911 and bring the patient back to the ED if:        A.  The seizure lasts longer than 5 minutes.       B.  The patient doesn't awaken shortly after the seizure  C.  The patient has new problems such as difficulty seeing, speaking or moving  D.  The patient was injured during the seizure  E.  The patient has a temperature over 102 F (39C)  F.  The patient vomited and now is having trouble breathing
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
## 2020-01-30 ENCOUNTER — Telehealth (INDEPENDENT_AMBULATORY_CARE_PROVIDER_SITE_OTHER): Payer: Medicaid Other | Admitting: Psychiatry

## 2020-01-30 ENCOUNTER — Encounter (HOSPITAL_COMMUNITY): Payer: Self-pay | Admitting: Psychiatry

## 2020-01-30 ENCOUNTER — Other Ambulatory Visit: Payer: Self-pay

## 2020-01-30 VITALS — Wt 159.0 lb

## 2020-01-30 DIAGNOSIS — F4310 Post-traumatic stress disorder, unspecified: Secondary | ICD-10-CM

## 2020-01-30 DIAGNOSIS — F319 Bipolar disorder, unspecified: Secondary | ICD-10-CM | POA: Diagnosis not present

## 2020-01-30 MED ORDER — ARIPIPRAZOLE (SENSOR) 5 MG PO TABS: 5.0000 mg | ORAL_TABLET | Freq: Every day | ORAL | 1 refills | Status: DC

## 2020-01-30 MED ORDER — BENZTROPINE MESYLATE 0.5 MG PO TABS: 0.5000 mg | ORAL_TABLET | Freq: Every day | ORAL | 1 refills | Status: DC

## 2020-01-30 MED ORDER — NORTRIPTYLINE HCL 75 MG PO CAPS: 75.0000 mg | ORAL_CAPSULE | Freq: Every day | ORAL | 1 refills | Status: DC
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
## 2020-03-13 ENCOUNTER — Other Ambulatory Visit: Payer: Self-pay

## 2020-03-13 ENCOUNTER — Encounter (HOSPITAL_COMMUNITY): Payer: Self-pay | Admitting: Psychiatry

## 2020-03-13 ENCOUNTER — Telehealth (INDEPENDENT_AMBULATORY_CARE_PROVIDER_SITE_OTHER): Payer: Medicaid Other | Admitting: Psychiatry

## 2020-03-13 VITALS — Wt 160.0 lb

## 2020-03-13 DIAGNOSIS — F4310 Post-traumatic stress disorder, unspecified: Secondary | ICD-10-CM

## 2020-03-13 DIAGNOSIS — F319 Bipolar disorder, unspecified: Secondary | ICD-10-CM

## 2020-03-13 MED ORDER — ARIPIPRAZOLE 15 MG PO TABS: 7.5000 mg | ORAL_TABLET | Freq: Every day | ORAL | 1 refills | Status: DC

## 2020-03-13 MED ORDER — BENZTROPINE MESYLATE 0.5 MG PO TABS: 0.5000 mg | ORAL_TABLET | Freq: Every day | ORAL | 1 refills | Status: DC

## 2020-03-13 MED ORDER — NORTRIPTYLINE HCL 75 MG PO CAPS: 75.0000 mg | ORAL_CAPSULE | Freq: Every day | ORAL | 1 refills | Status: DC
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
## 2020-04-23 ENCOUNTER — Telehealth: Payer: Self-pay | Admitting: Neurology

## 2020-04-23 MED ORDER — TOPIRAMATE 200 MG PO TABS: ORAL_TABLET | ORAL | 2 refills | Status: DC

## 2020-04-23 NOTE — Telephone Encounter (Signed)
Spoke with pt she stated that her seizures where worse than her stress seizures, yesterday her anxiety was really bad and could have played a part. Pt advised to increase her topamax 200mg  BID new script sent to pharmacy on file,

## 2020-04-23 NOTE — Telephone Encounter (Signed)
Patient states she had 4 seizures. They were 15 minutes apart. 3 of them were under a minute and the second one was very violent and lasted over a minute. She states her heart was beating very fast. She felt like she was "in overdrive". She states if she had another one she would've went to the hospital. She was very scared. The back of her ear was hurting after she had the seizures. Please call.

## 2020-04-23 NOTE — Telephone Encounter (Signed)
Pls ask patient if she felt these were more like her stress seizures, how are her anxiety and depression doing, is there increased stress recently? There is room to increase the Topamax to 200mg  BID, however if she or her husband feel they are more of the stress seizures, it is important that she follow closely with her therapist and psychiatrist. Thanks

## 2020-04-23 NOTE — Telephone Encounter (Signed)
Pt c/o: seizure Missed medications?  no Sleep deprived?  no Alcohol intake?  no Back to their usual baseline self?  yes Current medications prescribed by Dr. Karel Jarvis: none missed

## 2020-05-07 ENCOUNTER — Other Ambulatory Visit (HOSPITAL_COMMUNITY): Payer: Self-pay | Admitting: Psychiatry

## 2020-05-07 DIAGNOSIS — F319 Bipolar disorder, unspecified: Secondary | ICD-10-CM

## 2020-05-07 DIAGNOSIS — F4310 Post-traumatic stress disorder, unspecified: Secondary | ICD-10-CM

## 2020-05-15 ENCOUNTER — Other Ambulatory Visit (HOSPITAL_COMMUNITY): Payer: Self-pay | Admitting: Psychiatry

## 2020-05-15 DIAGNOSIS — F319 Bipolar disorder, unspecified: Secondary | ICD-10-CM

## 2020-05-15 DIAGNOSIS — F4310 Post-traumatic stress disorder, unspecified: Secondary | ICD-10-CM

## 2020-05-20 ENCOUNTER — Other Ambulatory Visit (HOSPITAL_COMMUNITY): Payer: Self-pay | Admitting: *Deleted

## 2020-05-20 DIAGNOSIS — F4310 Post-traumatic stress disorder, unspecified: Secondary | ICD-10-CM

## 2020-05-20 DIAGNOSIS — F319 Bipolar disorder, unspecified: Secondary | ICD-10-CM

## 2020-05-20 MED ORDER — BENZTROPINE MESYLATE 0.5 MG PO TABS: 0.5000 mg | ORAL_TABLET | Freq: Every day | ORAL | 1 refills | Status: DC

## 2020-05-20 MED ORDER — NORTRIPTYLINE HCL 75 MG PO CAPS: 75.0000 mg | ORAL_CAPSULE | Freq: Every day | ORAL | 1 refills | Status: DC

## 2020-05-20 MED ORDER — ARIPIPRAZOLE 15 MG PO TABS: 7.5000 mg | ORAL_TABLET | Freq: Every day | ORAL | 1 refills | Status: DC

## 2020-05-29 ENCOUNTER — Other Ambulatory Visit: Payer: Self-pay

## 2020-05-29 ENCOUNTER — Encounter: Payer: Self-pay | Admitting: Neurology

## 2020-05-29 ENCOUNTER — Telehealth (INDEPENDENT_AMBULATORY_CARE_PROVIDER_SITE_OTHER): Payer: Medicaid Other | Admitting: Neurology

## 2020-05-29 VITALS — Ht 67.0 in | Wt 169.0 lb

## 2020-05-29 DIAGNOSIS — G40309 Generalized idiopathic epilepsy and epileptic syndromes, not intractable, without status epilepticus: Secondary | ICD-10-CM | POA: Diagnosis not present

## 2020-05-29 DIAGNOSIS — G43009 Migraine without aura, not intractable, without status migrainosus: Secondary | ICD-10-CM | POA: Diagnosis not present

## 2020-05-29 DIAGNOSIS — F445 Conversion disorder with seizures or convulsions: Secondary | ICD-10-CM | POA: Diagnosis not present
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
## 2020-06-10 ENCOUNTER — Telehealth (INDEPENDENT_AMBULATORY_CARE_PROVIDER_SITE_OTHER): Payer: Medicaid Other | Admitting: Psychiatry

## 2020-06-10 ENCOUNTER — Other Ambulatory Visit: Payer: Self-pay

## 2020-06-10 ENCOUNTER — Encounter (HOSPITAL_COMMUNITY): Payer: Self-pay | Admitting: Psychiatry

## 2020-06-10 VITALS — Wt 169.0 lb

## 2020-06-10 DIAGNOSIS — F419 Anxiety disorder, unspecified: Secondary | ICD-10-CM | POA: Diagnosis not present

## 2020-06-10 DIAGNOSIS — F319 Bipolar disorder, unspecified: Secondary | ICD-10-CM

## 2020-06-10 DIAGNOSIS — F4310 Post-traumatic stress disorder, unspecified: Secondary | ICD-10-CM | POA: Diagnosis not present

## 2020-06-10 MED ORDER — ARIPIPRAZOLE 10 MG PO TABS: 10.0000 mg | ORAL_TABLET | Freq: Every day | ORAL | 1 refills | Status: DC

## 2020-06-10 MED ORDER — BENZTROPINE MESYLATE 0.5 MG PO TABS: 0.5000 mg | ORAL_TABLET | Freq: Every day | ORAL | 1 refills | Status: DC

## 2020-06-10 MED ORDER — NORTRIPTYLINE HCL 50 MG PO CAPS: 100.0000 mg | ORAL_CAPSULE | Freq: Every day | ORAL | 1 refills | Status: DC
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
## 2020-07-23 ENCOUNTER — Other Ambulatory Visit: Payer: Self-pay | Admitting: Neurology

## 2020-07-24 ENCOUNTER — Encounter (HOSPITAL_COMMUNITY): Payer: Self-pay | Admitting: Psychiatry

## 2020-07-24 ENCOUNTER — Other Ambulatory Visit: Payer: Self-pay

## 2020-07-24 ENCOUNTER — Telehealth (INDEPENDENT_AMBULATORY_CARE_PROVIDER_SITE_OTHER): Payer: Medicaid Other | Admitting: Psychiatry

## 2020-07-24 ENCOUNTER — Telehealth (HOSPITAL_COMMUNITY): Payer: Medicaid Other | Admitting: Psychiatry

## 2020-07-24 VITALS — Wt 165.0 lb

## 2020-07-24 DIAGNOSIS — F319 Bipolar disorder, unspecified: Secondary | ICD-10-CM | POA: Diagnosis not present

## 2020-07-24 DIAGNOSIS — F419 Anxiety disorder, unspecified: Secondary | ICD-10-CM

## 2020-07-24 DIAGNOSIS — F4310 Post-traumatic stress disorder, unspecified: Secondary | ICD-10-CM | POA: Diagnosis not present

## 2020-07-24 MED ORDER — ARIPIPRAZOLE 10 MG PO TABS: 10.0000 mg | ORAL_TABLET | Freq: Every day | ORAL | 2 refills | Status: DC

## 2020-07-24 MED ORDER — BENZTROPINE MESYLATE 0.5 MG PO TABS: 0.5000 mg | ORAL_TABLET | Freq: Every day | ORAL | 2 refills | Status: DC

## 2020-07-24 MED ORDER — NORTRIPTYLINE HCL 50 MG PO CAPS: 100.0000 mg | ORAL_CAPSULE | Freq: Every day | ORAL | 2 refills | Status: DC
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
## 2020-09-02 ENCOUNTER — Encounter: Payer: Self-pay | Admitting: Neurology

## 2020-09-02 NOTE — Progress Notes (Signed)
Confirmation #:[id number] W Benefit Plan:[employer] Health Plan:[employer] Prior Approval [id number] PA Type:PHARMACY Recipient:Nuriyah D Mcwilliams Recipient TG:[id number] N Requesting Provider Name:KAREN AQUINO Requesting Provider SK:[id number] Submission Date:09/02/2020 Status:APPROVED Effective Begin Date:09/02/2020 Effective End Date:08/28/2021

## 2020-09-30 ENCOUNTER — Encounter: Payer: Self-pay | Admitting: Neurology

## 2020-09-30 ENCOUNTER — Ambulatory Visit (INDEPENDENT_AMBULATORY_CARE_PROVIDER_SITE_OTHER): Payer: Medicaid Other | Admitting: Neurology

## 2020-09-30 ENCOUNTER — Other Ambulatory Visit: Payer: Self-pay

## 2020-09-30 VITALS — BP 134/84 | HR 87 | Resp 18 | Ht 68.0 in | Wt 158.0 lb

## 2020-09-30 DIAGNOSIS — G40309 Generalized idiopathic epilepsy and epileptic syndromes, not intractable, without status epilepticus: Secondary | ICD-10-CM

## 2020-09-30 DIAGNOSIS — G43009 Migraine without aura, not intractable, without status migrainosus: Secondary | ICD-10-CM

## 2020-09-30 DIAGNOSIS — F445 Conversion disorder with seizures or convulsions: Secondary | ICD-10-CM

## 2020-09-30 MED ORDER — LEVETIRACETAM 1000 MG PO TABS
1000.0000 mg | ORAL_TABLET | Freq: Two times a day (BID) | ORAL | 3 refills | Status: DC
Start: 2020-09-30 — End: 2021-04-02

## 2020-09-30 MED ORDER — EMGALITY 120 MG/ML ~~LOC~~ SOSY
1.0000 | PREFILLED_SYRINGE | SUBCUTANEOUS | 11 refills | Status: DC
Start: 2020-09-30 — End: 2021-04-02

## 2020-09-30 MED ORDER — TOPIRAMATE 200 MG PO TABS
ORAL_TABLET | ORAL | 3 refills | Status: DC
Start: 2020-09-30 — End: 2021-04-02
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
## 2020-10-21 ENCOUNTER — Telehealth (INDEPENDENT_AMBULATORY_CARE_PROVIDER_SITE_OTHER): Payer: Medicaid Other | Admitting: Psychiatry

## 2020-10-21 ENCOUNTER — Encounter (HOSPITAL_COMMUNITY): Payer: Self-pay | Admitting: Psychiatry

## 2020-10-21 ENCOUNTER — Other Ambulatory Visit: Payer: Self-pay

## 2020-10-21 DIAGNOSIS — F4310 Post-traumatic stress disorder, unspecified: Secondary | ICD-10-CM

## 2020-10-21 DIAGNOSIS — F319 Bipolar disorder, unspecified: Secondary | ICD-10-CM

## 2020-10-21 MED ORDER — BENZTROPINE MESYLATE 0.5 MG PO TABS: 0.5000 mg | ORAL_TABLET | Freq: Every day | ORAL | 2 refills | Status: DC

## 2020-10-21 MED ORDER — NORTRIPTYLINE HCL 50 MG PO CAPS: 100.0000 mg | ORAL_CAPSULE | Freq: Every day | ORAL | 2 refills | Status: DC

## 2020-10-21 MED ORDER — ARIPIPRAZOLE 10 MG PO TABS: 10.0000 mg | ORAL_TABLET | Freq: Every day | ORAL | 2 refills | Status: DC
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
## 2020-10-22 ENCOUNTER — Telehealth: Payer: Self-pay | Admitting: Neurology

## 2020-10-22 NOTE — Telephone Encounter (Signed)
Patient called in stating Dr. Karel Jarvis told her to call her today to remind her to fill out the [facility] paperwork for her. She knows Dr. Karel Jarvis is out of the office. It can wait until she gets back.

## 2020-10-22 NOTE — Telephone Encounter (Signed)
Pt called informed she needs to come by the office to sign DMV paperwork an then we can fax it for her. Pt stated she will get her husband to bring her by the office so she can sign it

## 2020-10-31 ENCOUNTER — Telehealth: Payer: Self-pay | Admitting: Neurology

## 2020-10-31 NOTE — Telephone Encounter (Signed)
New message    Patient is asking can paperwork be mailed to her to sign verse coming to office due to no transportation.

## 2020-10-31 NOTE — Telephone Encounter (Signed)
Pt called and informed that we will get her DMV paperwork in the mail for her

## 2021-01-10 ENCOUNTER — Telehealth: Payer: Self-pay | Admitting: Neurology

## 2021-01-10 NOTE — Telephone Encounter (Signed)
Pt called, She needs a medical documentation stating she has not had any seizures in order for her to keep DL till [birth year]/78/[birth year]. Fax number is [phone] and their address is [address] [place] [address]. [employer]

## 2021-01-10 NOTE — Telephone Encounter (Signed)
Called patient and confirmed that her last seizure was 04/23/20. Patient stated that she thinks a letter stating when her last seizure would be enough. The paper she got in the mail did not specify or provide any forms, so just a letter should be good enough.   Informed patient that I would let Dr. Karel Jarvis know and reach back out once I hear back.Patient verbalized understanding.

## 2021-01-10 NOTE — Telephone Encounter (Signed)
Pls confirm her last seizure was 04/23/2020. Is there a form to fill our or they just need a letter stating when her last seizure was? Thanks

## 2021-01-21 ENCOUNTER — Other Ambulatory Visit: Payer: Self-pay

## 2021-01-21 ENCOUNTER — Encounter (HOSPITAL_COMMUNITY): Payer: Self-pay | Admitting: Psychiatry

## 2021-01-21 ENCOUNTER — Telehealth (INDEPENDENT_AMBULATORY_CARE_PROVIDER_SITE_OTHER): Payer: Medicaid Other | Admitting: Psychiatry

## 2021-01-21 VITALS — Wt 160.0 lb

## 2021-01-21 DIAGNOSIS — F4310 Post-traumatic stress disorder, unspecified: Secondary | ICD-10-CM

## 2021-01-21 DIAGNOSIS — F900 Attention-deficit hyperactivity disorder, predominantly inattentive type: Secondary | ICD-10-CM

## 2021-01-21 DIAGNOSIS — F319 Bipolar disorder, unspecified: Secondary | ICD-10-CM | POA: Diagnosis not present

## 2021-01-21 MED ORDER — ATOMOXETINE HCL 18 MG PO CAPS: 18.0000 mg | ORAL_CAPSULE | Freq: Two times a day (BID) | ORAL | 1 refills | Status: DC

## 2021-01-21 MED ORDER — ARIPIPRAZOLE 10 MG PO TABS: 10.0000 mg | ORAL_TABLET | Freq: Every day | ORAL | 1 refills | Status: DC

## 2021-01-21 MED ORDER — NORTRIPTYLINE HCL 50 MG PO CAPS: 100.0000 mg | ORAL_CAPSULE | Freq: Every day | ORAL | 1 refills | Status: DC

## 2021-01-21 MED ORDER — BENZTROPINE MESYLATE 0.5 MG PO TABS: 0.5000 mg | ORAL_TABLET | Freq: Every day | ORAL | 1 refills | Status: DC
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
## 2021-02-06 ENCOUNTER — Telehealth (HOSPITAL_COMMUNITY): Payer: Self-pay | Admitting: *Deleted

## 2021-02-06 ENCOUNTER — Other Ambulatory Visit (HOSPITAL_COMMUNITY): Payer: Self-pay | Admitting: Psychiatry

## 2021-02-06 DIAGNOSIS — F4310 Post-traumatic stress disorder, unspecified: Secondary | ICD-10-CM

## 2021-02-06 DIAGNOSIS — F319 Bipolar disorder, unspecified: Secondary | ICD-10-CM

## 2021-02-06 MED ORDER — ATOMOXETINE HCL 25 MG PO CAPS: 25.0000 mg | ORAL_CAPSULE | Freq: Two times a day (BID) | ORAL | 0 refills | Status: DC

## 2021-02-06 NOTE — Telephone Encounter (Signed)
Tell her I sent in Strattera 25 mg bid, other changes will need to be discussed with Dr. Lolly Mustache

## 2021-02-06 NOTE — Telephone Encounter (Signed)
Pt of Dr. Lolly Mustache who is requesting increase in dose of Strattera (currently on 36 mg), or more likely a change in medication. Pt says that medication is not helping with focus at all. Pt has long h/o ADHD since childhood however has a seizure d/o so Dr. Lolly Mustache hesitant to resume stimulants. Pt  has an upcoming appointment on 03/25/21. Please review and advise. Thank you.

## 2021-02-07 NOTE — Telephone Encounter (Signed)
Will provide letter on her f/u in 03/2021.

## 2021-03-02 ENCOUNTER — Other Ambulatory Visit (HOSPITAL_COMMUNITY): Payer: Self-pay | Admitting: Psychiatry

## 2021-03-25 ENCOUNTER — Encounter (HOSPITAL_COMMUNITY): Payer: Self-pay | Admitting: Psychiatry

## 2021-03-25 ENCOUNTER — Other Ambulatory Visit (HOSPITAL_COMMUNITY): Payer: Self-pay | Admitting: Psychiatry

## 2021-03-25 ENCOUNTER — Telehealth (INDEPENDENT_AMBULATORY_CARE_PROVIDER_SITE_OTHER): Payer: Medicaid Other | Admitting: Psychiatry

## 2021-03-25 ENCOUNTER — Other Ambulatory Visit: Payer: Self-pay

## 2021-03-25 VITALS — Wt 165.0 lb

## 2021-03-25 DIAGNOSIS — F319 Bipolar disorder, unspecified: Secondary | ICD-10-CM

## 2021-03-25 DIAGNOSIS — F4310 Post-traumatic stress disorder, unspecified: Secondary | ICD-10-CM | POA: Diagnosis not present

## 2021-03-25 DIAGNOSIS — F900 Attention-deficit hyperactivity disorder, predominantly inattentive type: Secondary | ICD-10-CM

## 2021-03-25 MED ORDER — NORTRIPTYLINE HCL 50 MG PO CAPS: 100.0000 mg | ORAL_CAPSULE | Freq: Every day | ORAL | 1 refills | Status: DC

## 2021-03-25 MED ORDER — ATOMOXETINE HCL 60 MG PO CAPS: 60.0000 mg | ORAL_CAPSULE | Freq: Every day | ORAL | 1 refills | Status: DC

## 2021-03-25 MED ORDER — ARIPIPRAZOLE 10 MG PO TABS: 10.0000 mg | ORAL_TABLET | Freq: Every day | ORAL | 1 refills | Status: DC

## 2021-03-25 MED ORDER — BENZTROPINE MESYLATE 0.5 MG PO TABS: 0.5000 mg | ORAL_TABLET | Freq: Every day | ORAL | 1 refills | Status: DC
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
## 2021-04-02 ENCOUNTER — Encounter: Payer: Self-pay | Admitting: Neurology

## 2021-04-02 ENCOUNTER — Other Ambulatory Visit: Payer: Self-pay

## 2021-04-02 ENCOUNTER — Telehealth (INDEPENDENT_AMBULATORY_CARE_PROVIDER_SITE_OTHER): Payer: Medicaid Other | Admitting: Neurology

## 2021-04-02 VITALS — Ht 68.0 in | Wt 160.0 lb

## 2021-04-02 DIAGNOSIS — G40309 Generalized idiopathic epilepsy and epileptic syndromes, not intractable, without status epilepticus: Secondary | ICD-10-CM | POA: Diagnosis not present

## 2021-04-02 DIAGNOSIS — G43009 Migraine without aura, not intractable, without status migrainosus: Secondary | ICD-10-CM

## 2021-04-02 DIAGNOSIS — F445 Conversion disorder with seizures or convulsions: Secondary | ICD-10-CM | POA: Diagnosis not present

## 2021-04-02 MED ORDER — TOPIRAMATE 200 MG PO TABS: ORAL_TABLET | ORAL | 3 refills | Status: DC

## 2021-04-02 MED ORDER — LEVETIRACETAM 1000 MG PO TABS: 1000.0000 mg | ORAL_TABLET | Freq: Two times a day (BID) | ORAL | 3 refills | Status: DC

## 2021-04-02 MED ORDER — EMGALITY 120 MG/ML ~~LOC~~ SOSY: 1.0000 | PREFILLED_SYRINGE | SUBCUTANEOUS | 11 refills | Status: DC
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
## 2021-04-02 NOTE — Progress Notes (Signed)
Hurt arm pos rotator cuff surgery has to have mri
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
## 2021-04-24 ENCOUNTER — Telehealth: Payer: Self-pay | Admitting: Neurology

## 2021-04-24 NOTE — Telephone Encounter (Signed)
Pt called in and left a message stating she has been having "really bad" memory issues. Wondering what she can do to help it?

## 2021-04-25 NOTE — Telephone Encounter (Signed)
Please let her know to speak to her PCP, sometimes if thyroid or B12 levels are off, it can affect memory so if those have not been checked, they can do for her. At her age though, the most common cause of memory changes are anxiety, stress, and depression, if these are still a major issues for her, pls have her speak to her psychiatrist and therapist because these are treatable causes of memory loss. Thanks

## 2021-04-28 NOTE — Telephone Encounter (Signed)
Pt called and informed to speak to her PCP, sometimes if thyroid or B12 levels are off, it can affect memory so if those have not been checked, they can do for her. At her age though, the most common cause of memory changes are anxiety, stress, and depression, if these are still a major issues for her, pls have her speak to her psychiatrist and therapist because these are treatable causes of memory loss

## 2021-05-12 ENCOUNTER — Encounter (HOSPITAL_COMMUNITY): Payer: Self-pay | Admitting: Psychiatry

## 2021-05-12 ENCOUNTER — Other Ambulatory Visit: Payer: Self-pay

## 2021-05-12 ENCOUNTER — Telehealth (HOSPITAL_BASED_OUTPATIENT_CLINIC_OR_DEPARTMENT_OTHER): Payer: Medicaid Other | Admitting: Psychiatry

## 2021-05-12 DIAGNOSIS — F4310 Post-traumatic stress disorder, unspecified: Secondary | ICD-10-CM

## 2021-05-12 DIAGNOSIS — F319 Bipolar disorder, unspecified: Secondary | ICD-10-CM

## 2021-05-12 DIAGNOSIS — F900 Attention-deficit hyperactivity disorder, predominantly inattentive type: Secondary | ICD-10-CM | POA: Diagnosis not present

## 2021-05-12 MED ORDER — ARIPIPRAZOLE 10 MG PO TABS: 10.0000 mg | ORAL_TABLET | Freq: Every day | ORAL | 1 refills | Status: DC

## 2021-05-12 MED ORDER — ATOMOXETINE HCL 80 MG PO CAPS: 80.0000 mg | ORAL_CAPSULE | Freq: Every day | ORAL | 1 refills | Status: DC

## 2021-05-12 MED ORDER — NORTRIPTYLINE HCL 50 MG PO CAPS: 100.0000 mg | ORAL_CAPSULE | Freq: Every day | ORAL | 1 refills | Status: DC

## 2021-05-12 MED ORDER — BENZTROPINE MESYLATE 0.5 MG PO TABS: 0.5000 mg | ORAL_TABLET | Freq: Every day | ORAL | 1 refills | Status: DC
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
## 2021-05-14 ENCOUNTER — Other Ambulatory Visit (HOSPITAL_COMMUNITY): Payer: Self-pay | Admitting: Psychiatry

## 2021-05-14 DIAGNOSIS — F900 Attention-deficit hyperactivity disorder, predominantly inattentive type: Secondary | ICD-10-CM

## 2021-05-15 ENCOUNTER — Encounter: Payer: Self-pay | Admitting: Neurology

## 2021-06-04 ENCOUNTER — Encounter: Payer: Self-pay | Admitting: Neurology

## 2021-06-09 ENCOUNTER — Telehealth (HOSPITAL_COMMUNITY): Payer: Medicaid Other | Admitting: Psychiatry

## 2021-07-06 DIAGNOSIS — Z87442 Personal history of urinary calculi: Secondary | ICD-10-CM

## 2021-07-06 HISTORY — DX: Personal history of urinary calculi: Z87.442

## 2021-07-08 ENCOUNTER — Other Ambulatory Visit (HOSPITAL_COMMUNITY): Payer: Self-pay | Admitting: Psychiatry

## 2021-07-08 DIAGNOSIS — F900 Attention-deficit hyperactivity disorder, predominantly inattentive type: Secondary | ICD-10-CM

## 2021-07-14 ENCOUNTER — Encounter (HOSPITAL_COMMUNITY): Payer: Self-pay | Admitting: Psychiatry

## 2021-07-14 ENCOUNTER — Other Ambulatory Visit: Payer: Self-pay

## 2021-07-14 ENCOUNTER — Telehealth (HOSPITAL_BASED_OUTPATIENT_CLINIC_OR_DEPARTMENT_OTHER): Payer: Medicaid Other | Admitting: Psychiatry

## 2021-07-14 DIAGNOSIS — F900 Attention-deficit hyperactivity disorder, predominantly inattentive type: Secondary | ICD-10-CM

## 2021-07-14 DIAGNOSIS — F4310 Post-traumatic stress disorder, unspecified: Secondary | ICD-10-CM

## 2021-07-14 DIAGNOSIS — F319 Bipolar disorder, unspecified: Secondary | ICD-10-CM

## 2021-07-14 MED ORDER — ARIPIPRAZOLE 10 MG PO TABS: 10.0000 mg | ORAL_TABLET | Freq: Every day | ORAL | 2 refills | Status: DC

## 2021-07-14 MED ORDER — BENZTROPINE MESYLATE 0.5 MG PO TABS: 0.5000 mg | ORAL_TABLET | Freq: Every day | ORAL | 2 refills | Status: DC

## 2021-07-14 MED ORDER — NORTRIPTYLINE HCL 50 MG PO CAPS: 100.0000 mg | ORAL_CAPSULE | Freq: Every day | ORAL | 2 refills | Status: DC

## 2021-07-14 MED ORDER — ATOMOXETINE HCL 80 MG PO CAPS: 80.0000 mg | ORAL_CAPSULE | Freq: Every day | ORAL | 2 refills | Status: DC
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
## 2021-08-13 ENCOUNTER — Telehealth (HOSPITAL_COMMUNITY): Payer: Self-pay | Admitting: *Deleted

## 2021-08-13 ENCOUNTER — Other Ambulatory Visit (HOSPITAL_COMMUNITY): Payer: Self-pay | Admitting: *Deleted

## 2021-08-13 DIAGNOSIS — F319 Bipolar disorder, unspecified: Secondary | ICD-10-CM

## 2021-08-13 DIAGNOSIS — Z79899 Other long term (current) drug therapy: Secondary | ICD-10-CM

## 2021-08-13 NOTE — Telephone Encounter (Signed)
Will do!

## 2021-08-13 NOTE — Telephone Encounter (Signed)
Writer spoke with pt to advise that lab orders have been sent to [facility] @ [address] In [place]. Pt verbalizes understanding.

## 2021-08-13 NOTE — Telephone Encounter (Signed)
Can you get the blood work?  Please order CBC, CMP, hemoglobin A1c and also pregnancy test.  She can cut down her Abilify to 5 mg however she has been taking these medication for a while.  I would rather have her blood work first before adjusting medication.

## 2021-08-13 NOTE — Telephone Encounter (Signed)
Pt called with c/o 30 lb weight gain since starting " a medicine Dr. Adele Schilder put me on". She says she doesn't know which medicine is causing this. Pt has been on Pamelor and Abilify for quite some time. Pt denies that she could be pregnant. Pt denies much of a change in appetite. Says she just doesn't want to gain any more weight. Please review and advise.

## 2021-08-19 ENCOUNTER — Telehealth (HOSPITAL_COMMUNITY): Payer: Self-pay | Admitting: *Deleted

## 2021-08-19 LAB — CMP14+EGFR
ALT: 15 IU/L (ref 0–32)
AST: 18 IU/L (ref 0–40)
Albumin/Globulin Ratio: 2.4 — ABNORMAL HIGH (ref 1.2–2.2)
Albumin: 4.6 g/dL (ref 3.8–4.8)
Alkaline Phosphatase: 57 IU/L (ref 44–121)
BUN/Creatinine Ratio: 9 (ref 9–23)
BUN: 7 mg/dL (ref 6–20)
Bilirubin Total: 0.2 mg/dL (ref 0.0–1.2)
CO2: 20 mmol/L (ref 20–29)
Calcium: 8.9 mg/dL (ref 8.7–10.2)
Chloride: 110 mmol/L — ABNORMAL HIGH (ref 96–106)
Creatinine, Ser: 0.8 mg/dL (ref 0.57–1.00)
Globulin, Total: 1.9 g/dL (ref 1.5–4.5)
Glucose: 109 mg/dL — ABNORMAL HIGH (ref 70–99)
Potassium: 3.9 mmol/L (ref 3.5–5.2)
Sodium: 143 mmol/L (ref 134–144)
Total Protein: 6.5 g/dL (ref 6.0–8.5)
eGFR: 100 mL/min/{1.73_m2} (ref >59)

## 2021-08-19 LAB — CBC WITH DIFFERENTIAL/PLATELET
Basophils Absolute: 0 10*3/uL (ref 0.0–0.2)
Basos: 1 %
EOS (ABSOLUTE): 0.1 10*3/uL (ref 0.0–0.4)
Eos: 1 %
Hematocrit: 37.9 % (ref 34.0–46.6)
Hemoglobin: 13 g/dL (ref 11.1–15.9)
Immature Grans (Abs): 0 10*3/uL (ref 0.0–0.1)
Immature Granulocytes: 0 %
Lymphocytes Absolute: 1.8 10*3/uL (ref 0.7–3.1)
Lymphs: 32 %
MCH: 30.8 pg (ref 26.6–33.0)
MCHC: 34.3 g/dL (ref 31.5–35.7)
MCV: 90 fL (ref 79–97)
Monocytes Absolute: 0.3 10*3/uL (ref 0.1–0.9)
Monocytes: 6 %
Neutrophils Absolute: 3.4 10*3/uL (ref 1.4–7.0)
Neutrophils: 60 %
Platelets: 214 10*3/uL (ref 150–450)
RBC: 4.22 x10E6/uL (ref 3.77–5.28)
RDW: 11.8 % (ref 11.7–15.4)
WBC: 5.6 10*3/uL (ref 3.4–10.8)

## 2021-08-19 LAB — HEMOGLOBIN A1C
Est. average glucose Bld gHb Est-mCnc: 97 mg/dL
Hgb A1c MFr Bld: 5 % (ref 4.8–5.6)

## 2021-08-19 LAB — HCG, SERUM, QUALITATIVE: hCG,Beta Subunit,Qual,Serum: NEGATIVE m[IU]/mL (ref <6)

## 2021-08-19 NOTE — Telephone Encounter (Signed)
Labs resulted on 08/18/21 received from [facility]. Serum HcG negative. Glucose @ 109. Chloride @ 110, and A/G Ratio 2.4. All other labs WNL/

## 2021-09-03 ENCOUNTER — Encounter: Payer: Self-pay | Admitting: Neurology

## 2021-09-08 ENCOUNTER — Telehealth (HOSPITAL_COMMUNITY): Payer: Self-pay | Admitting: *Deleted

## 2021-09-08 NOTE — Telephone Encounter (Signed)
Pt called asking for lab results and weight gain that she feels is r/t medication, Pamelor or Abilify. Pt isn't seen again until 01/1022. Please review. ?

## 2021-09-11 ENCOUNTER — Other Ambulatory Visit (HOSPITAL_COMMUNITY): Payer: Self-pay | Admitting: *Deleted

## 2021-09-11 DIAGNOSIS — F319 Bipolar disorder, unspecified: Secondary | ICD-10-CM

## 2021-09-11 DIAGNOSIS — F4310 Post-traumatic stress disorder, unspecified: Secondary | ICD-10-CM

## 2021-09-11 MED ORDER — ARIPIPRAZOLE 5 MG PO TABS: 5.0000 mg | ORAL_TABLET | Freq: Every day | ORAL | 0 refills | Status: DC

## 2021-09-11 NOTE — Telephone Encounter (Signed)
Her labs are stable. She can reduced Abilify in half tab to take 5 mg only and move appointment sooner.  ?

## 2021-09-15 ENCOUNTER — Ambulatory Visit (INDEPENDENT_AMBULATORY_CARE_PROVIDER_SITE_OTHER): Payer: Self-pay | Admitting: Neurology

## 2021-09-15 ENCOUNTER — Encounter: Payer: Self-pay | Admitting: Neurology

## 2021-09-15 ENCOUNTER — Ambulatory Visit: Payer: Medicaid Other | Admitting: Neurology

## 2021-09-15 ENCOUNTER — Telehealth: Payer: Self-pay

## 2021-09-15 ENCOUNTER — Other Ambulatory Visit: Payer: Self-pay

## 2021-09-15 VITALS — BP 129/71 | HR 74 | Resp 18 | Ht 68.0 in | Wt 181.0 lb

## 2021-09-15 DIAGNOSIS — F445 Conversion disorder with seizures or convulsions: Secondary | ICD-10-CM

## 2021-09-15 DIAGNOSIS — G40309 Generalized idiopathic epilepsy and epileptic syndromes, not intractable, without status epilepticus: Secondary | ICD-10-CM

## 2021-09-15 DIAGNOSIS — G43009 Migraine without aura, not intractable, without status migrainosus: Secondary | ICD-10-CM

## 2021-09-15 MED ORDER — DIVALPROEX SODIUM ER 500 MG PO TB24: ORAL_TABLET | ORAL | 5 refills | Status: DC

## 2021-09-15 MED ORDER — LEVETIRACETAM 1000 MG PO TABS: 1000.0000 mg | ORAL_TABLET | Freq: Two times a day (BID) | ORAL | 3 refills | Status: DC

## 2021-09-15 MED ORDER — EMGALITY 120 MG/ML ~~LOC~~ SOSY: 1.0000 | PREFILLED_SYRINGE | SUBCUTANEOUS | 11 refills | Status: DC

## 2021-09-15 NOTE — Telephone Encounter (Signed)
PA for [facility] started faxed to [place] [facility] fax number [phone] ?
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
## 2021-09-23 ENCOUNTER — Encounter (HOSPITAL_COMMUNITY): Payer: Self-pay | Admitting: Psychiatry

## 2021-09-23 ENCOUNTER — Telehealth (HOSPITAL_BASED_OUTPATIENT_CLINIC_OR_DEPARTMENT_OTHER): Payer: Medicaid Other | Admitting: Psychiatry

## 2021-09-23 ENCOUNTER — Other Ambulatory Visit: Payer: Self-pay

## 2021-09-23 VITALS — Wt 173.0 lb

## 2021-09-23 DIAGNOSIS — F4310 Post-traumatic stress disorder, unspecified: Secondary | ICD-10-CM | POA: Diagnosis not present

## 2021-09-23 DIAGNOSIS — F319 Bipolar disorder, unspecified: Secondary | ICD-10-CM | POA: Diagnosis not present

## 2021-09-23 DIAGNOSIS — F900 Attention-deficit hyperactivity disorder, predominantly inattentive type: Secondary | ICD-10-CM | POA: Diagnosis not present

## 2021-09-23 MED ORDER — BENZTROPINE MESYLATE 0.5 MG PO TABS: 0.5000 mg | ORAL_TABLET | Freq: Every day | ORAL | 0 refills | Status: DC

## 2021-09-23 MED ORDER — GABAPENTIN 100 MG PO CAPS: ORAL_CAPSULE | ORAL | 0 refills | Status: DC

## 2021-09-23 MED ORDER — ATOMOXETINE HCL 60 MG PO CAPS: 60.0000 mg | ORAL_CAPSULE | Freq: Every day | ORAL | 0 refills | Status: DC

## 2021-09-23 MED ORDER — ARIPIPRAZOLE 5 MG PO TABS: 5.0000 mg | ORAL_TABLET | Freq: Every day | ORAL | 0 refills | Status: DC
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
## 2021-09-24 ENCOUNTER — Telehealth (HOSPITAL_COMMUNITY): Payer: Self-pay | Admitting: *Deleted

## 2021-09-24 NOTE — Telephone Encounter (Signed)
Received fax from pt pharmacy that pt is requesting a 90 day supply of the Cogentin 0.5 mg tabs. Pt next appointment scheduled for 10/07/21. Please review. ?

## 2021-09-25 ENCOUNTER — Encounter: Payer: Self-pay | Admitting: Neurology

## 2021-09-25 NOTE — Telephone Encounter (Signed)
Will wait as may need dose adjustment. ?

## 2021-09-26 ENCOUNTER — Encounter: Payer: Self-pay | Admitting: Neurology

## 2021-09-29 ENCOUNTER — Telehealth: Payer: Self-pay | Admitting: Neurology

## 2021-09-29 NOTE — Telephone Encounter (Signed)
Patient called back regarding the video.  Asked if she could send thru email, explained that we cannot send things thru email. ?

## 2021-09-29 NOTE — Telephone Encounter (Signed)
Pls let her know that I have tried opening it on 3 different computers, not sure if sending through email would be different, but also we cannot send/receive patient files through unsecured email. Is there a way she can ask someone with a different phone (maybe iphone) to take a video of her video and try to send it again?  ?

## 2021-09-30 ENCOUNTER — Encounter: Payer: Self-pay | Admitting: Neurology

## 2021-09-30 NOTE — Telephone Encounter (Signed)
Called pt mom did the video on her phone and sent it to [facility] who sent it to [place], they are going to send video from moms phone to see if that works. Rhonda Schroeder had a seizure about 5 mins before I called she was very hard to understand on the phone I talked to her mom to get information,  ? ?

## 2021-10-01 ENCOUNTER — Telehealth: Payer: Self-pay

## 2021-10-01 ENCOUNTER — Other Ambulatory Visit (HOSPITAL_COMMUNITY): Payer: Self-pay

## 2021-10-01 ENCOUNTER — Encounter: Payer: Self-pay | Admitting: Neurology

## 2021-10-01 ENCOUNTER — Telehealth: Payer: Self-pay | Admitting: Neurology

## 2021-10-01 ENCOUNTER — Other Ambulatory Visit: Payer: Self-pay

## 2021-10-01 ENCOUNTER — Telehealth (HOSPITAL_COMMUNITY): Payer: Self-pay | Admitting: *Deleted

## 2021-10-01 DIAGNOSIS — G40309 Generalized idiopathic epilepsy and epileptic syndromes, not intractable, without status epilepticus: Secondary | ICD-10-CM

## 2021-10-01 MED ORDER — DIVALPROEX SODIUM ER 500 MG PO TB24: ORAL_TABLET | ORAL | 5 refills | Status: DC

## 2021-10-01 NOTE — Telephone Encounter (Signed)
Pt called with c/o that the Neurontin is not working; not helping her sleep. Pt also says that she's having seizures again. Pt has an upcoming appointment with you on 10/07/21. She has appointment scheduled with Neurology on 01/16/22 and has been in touch with them as well fyi. Please review and advise regarding HS medication.  ?

## 2021-10-01 NOTE — Telephone Encounter (Signed)
Pt called an informed that Dr Karel Jarvis would like for her to increase her Depakote 500 mg Take 1 tab in AM, 2 tabs in PM script [facility] to [facility] [place]  ?

## 2021-10-01 NOTE — Telephone Encounter (Signed)
See other phone notes.

## 2021-10-01 NOTE — Telephone Encounter (Signed)
Patient Advocate Encounter ?  ?Received notification from [facility] that prior authorization for Emgality 120mg /ml Syringes is required by his/her insurance [place] [employer] ?  ?PA submitted on 10/01/21 ? ?[place] TRACKS#: 10/03/21 W ? ?Status is pending ?   ?[facility] will continue to follow: ? ?Patient Advocate ?Fax: [phone]  ?

## 2021-10-01 NOTE — Telephone Encounter (Signed)
Pls let her know I would like to increase the Depakote 500mg : Take 1 tab in AM, 2 tabs in PM. Pls send in updated Rx, thanks ?

## 2021-10-01 NOTE — Telephone Encounter (Signed)
Patient Advocate Encounter ? ?Prior Authorization for [employer] 120mg /ml syringe has been approved.   ? ?[employer] Tracks#: [id number] ? ?Effective dates: 10/01/21 through 10/01/22 ? ?Per Test Claim Patients co-pay is $4.  ? ?Spoke with Pharmacy to Process. ? ?Patient Advocate ?Fax: [phone]  ?

## 2021-10-01 NOTE — Telephone Encounter (Signed)
Her neurologist increased her Depakote on March 27.  It is too early to add more medication.  She should wait for Depakote because it would help her sleep and seizures.  She can stop the gabapentin if it is not working. ?

## 2021-10-01 NOTE — Telephone Encounter (Signed)
Patient is returning heathers call about an appt.  ?

## 2021-10-01 NOTE — Addendum Note (Signed)
Addended by: Jake Seats on: 10/01/2021 09:40 AM ? ? Modules accepted: Orders ? ?

## 2021-10-01 NOTE — Telephone Encounter (Signed)
Patient called back regarding the video of seizure she has sent.  She states that she is still lightheaded and dizzy.  Feels like she is going to have more seizures. ?

## 2021-10-06 ENCOUNTER — Other Ambulatory Visit: Payer: Self-pay

## 2021-10-06 ENCOUNTER — Telehealth: Payer: Self-pay

## 2021-10-06 ENCOUNTER — Encounter: Payer: Self-pay | Admitting: Neurology

## 2021-10-06 ENCOUNTER — Telehealth: Payer: Self-pay | Admitting: Neurology

## 2021-10-06 MED ORDER — VALTOCO 20 MG DOSE 10 MG/0.1ML NA LQPK: NASAL | 5 refills | Status: DC

## 2021-10-06 NOTE — Telephone Encounter (Signed)
Spoke with pt and her mother she is going to send pictures of the rash in my chart . Pt was advised to Stop the Depakote, ok to take Benadryl 25mg  2 tabs now. Please increase the Keppra 1000mg : take 1 and 1/2 tabs BID in the meantime since we are stopping the Depakote. She was told that If allergic reaction starts affecting breathing, go to ER. Pt daughter stated that last night Rhonda Schroeder had a seizure in her sleep.  ?

## 2021-10-06 NOTE — Addendum Note (Signed)
Addended by: Cameron Sprang on: 10/06/2021 02:07 PM ? ? Modules accepted: Orders ? ?

## 2021-10-06 NOTE — Telephone Encounter (Signed)
Patient called and said she is having an allergic reaction to her Depakote.  ? ?She said she has a red burning rash all over her body, especially her face. ? ?She had an EEG today but her daughter is sick with vomiting so she rescheduled. ? ?[facility] in [place] ? ?Per Herbert Seta: send back as urgent so Dr. Karel Jarvis can advise. ?

## 2021-10-06 NOTE — Telephone Encounter (Signed)
Pls let patient know that I will also send in a rescue nasal spray for family to spray to shorten and hopefully prevent a second seizure. It's called Valtoco, may make her drowsy. Thanks ?

## 2021-10-06 NOTE — Telephone Encounter (Signed)
Pls ask her to send pictures of the rash. Stop the Depakote, ok to take Benadryl 25mg  2 tabs now. Please increase the Keppra 1000mg : take 1 and 1/2 tabs BID in the meantime since we are stopping the Depakote. If allergic reaction starts affecting breathing, go to ER, thanks ?

## 2021-10-06 NOTE — Telephone Encounter (Signed)
Pt called an informed that Valtoco was sent in  ?

## 2021-10-06 NOTE — Telephone Encounter (Signed)
Pls see MyChart message response, I sent in [facility] Rx, thanks ?

## 2021-10-06 NOTE — Telephone Encounter (Signed)
Pt called an informed that Dr Karel Jarvis sent in [facility] Nasal spray a rescue medication for when she has a seizure for her mom to use one spray each nostril  ?

## 2021-10-07 ENCOUNTER — Ambulatory Visit: Payer: Medicaid Other | Admitting: Neurology

## 2021-10-07 ENCOUNTER — Telehealth (HOSPITAL_COMMUNITY): Payer: Medicaid Other | Admitting: Psychiatry

## 2021-10-07 ENCOUNTER — Telehealth (HOSPITAL_BASED_OUTPATIENT_CLINIC_OR_DEPARTMENT_OTHER): Payer: Medicaid Other | Admitting: Psychiatry

## 2021-10-07 ENCOUNTER — Encounter (HOSPITAL_COMMUNITY): Payer: Self-pay | Admitting: Psychiatry

## 2021-10-07 VITALS — Wt 173.0 lb

## 2021-10-07 DIAGNOSIS — F319 Bipolar disorder, unspecified: Secondary | ICD-10-CM | POA: Diagnosis not present

## 2021-10-07 DIAGNOSIS — F4310 Post-traumatic stress disorder, unspecified: Secondary | ICD-10-CM

## 2021-10-07 DIAGNOSIS — F900 Attention-deficit hyperactivity disorder, predominantly inattentive type: Secondary | ICD-10-CM | POA: Diagnosis not present

## 2021-10-07 MED ORDER — GABAPENTIN 300 MG PO CAPS: ORAL_CAPSULE | ORAL | 1 refills | Status: DC

## 2021-10-07 MED ORDER — ARIPIPRAZOLE 5 MG PO TABS: 5.0000 mg | ORAL_TABLET | Freq: Every day | ORAL | 0 refills | Status: DC

## 2021-10-07 MED ORDER — BENZTROPINE MESYLATE 0.5 MG PO TABS: 0.5000 mg | ORAL_TABLET | Freq: Every day | ORAL | 0 refills | Status: DC

## 2021-10-07 MED ORDER — NORTRIPTYLINE HCL 50 MG PO CAPS: 100.0000 mg | ORAL_CAPSULE | Freq: Every day | ORAL | 1 refills | Status: DC
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
## 2021-10-08 ENCOUNTER — Ambulatory Visit: Payer: Medicaid Other | Admitting: Neurology

## 2021-10-09 ENCOUNTER — Other Ambulatory Visit: Payer: Self-pay

## 2021-10-09 ENCOUNTER — Ambulatory Visit (INDEPENDENT_AMBULATORY_CARE_PROVIDER_SITE_OTHER): Payer: Medicaid Other | Admitting: Neurology

## 2021-10-09 DIAGNOSIS — G40309 Generalized idiopathic epilepsy and epileptic syndromes, not intractable, without status epilepticus: Secondary | ICD-10-CM

## 2021-10-15 ENCOUNTER — Encounter: Payer: Self-pay | Admitting: Neurology

## 2021-10-19 ENCOUNTER — Encounter: Payer: Self-pay | Admitting: Neurology

## 2021-10-19 DIAGNOSIS — F319 Bipolar disorder, unspecified: Secondary | ICD-10-CM

## 2021-10-19 DIAGNOSIS — F4310 Post-traumatic stress disorder, unspecified: Secondary | ICD-10-CM
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
## 2021-10-20 NOTE — Telephone Encounter (Signed)
Called pateint for more information was unable to reach left message  ?

## 2021-10-21 ENCOUNTER — Other Ambulatory Visit: Payer: Self-pay | Admitting: Neurology

## 2021-10-21 DIAGNOSIS — F445 Conversion disorder with seizures or convulsions: Secondary | ICD-10-CM

## 2021-10-21 DIAGNOSIS — G40309 Generalized idiopathic epilepsy and epileptic syndromes, not intractable, without status epilepticus: Secondary | ICD-10-CM

## 2021-10-21 MED ORDER — GABAPENTIN 300 MG PO CAPS: ORAL_CAPSULE | ORAL | 1 refills | Status: DC

## 2021-10-22 NOTE — Procedures (Signed)
ELECTROENCEPHALOGRAM REPORT ? ?Date of Study: 10/09/2021 ? ?Patient's Name: Rhonda Schroeder ?MRN: [id number] ?Date of Birth: 02-Aug-1990 ? ?Referring Provider: Dr. Patrcia Dolly ? ?Clinical History: This is a 31 year old woman with a history of co-existing epilepsy and psychogenic non-epileptic events with an increase in seizures. EEG for classification. ? ?Medications: ?NEURONTIN 300 MG capsule ?KEPPRA 1000 MG tablet ?TOPAMAX 200 MG tablet ?PAMELOR 50 MG capsule ?ABILIFY 5 MG tablet ?STRATTERA 80 MG capsule ?COGENTIN 0.5 MG tablet ?EMGALITY 120 MG/ML SOSY ?ADVIL,MOTRIN 400 MG tablet ?VALTOCO 20 MG dose  2X10MG /0.1ML LQPK ? ? ?Technical Summary: ?A multichannel digital 1-hour EEG recording measured by the international 10-20 system with electrodes applied with paste and impedances below 5000 ohms performed in our laboratory with EKG monitoring in an awake and asleep patient.  Hyperventilation and photic stimulation were performed.  The digital EEG was referentially recorded, reformatted, and digitally filtered in a variety of bipolar and referential montages for optimal display.   ? ?Description: ?The patient is awake and asleep during the recording.  During maximal wakefulness, there is a symmetric, medium voltage 9-10 Hz posterior dominant rhythm that attenuates with eye opening.  The record is symmetric.  During drowsiness and sleep, there is an increase in theta slowing of the background.  Vertex waves and symmetric sleep spindles were seen.  Photic stimulation did not elicit any abnormalities.  There were no epileptiform discharges or electrographic seizures seen.   ? ?EKG lead was unremarkable. ? ?Impression: ?This 1-hour awake and asleep EEG is normal.   ? ?Clinical Correlation: ?A normal EEG does not exclude a clinical diagnosis of epilepsy.  If further clinical questions remain, prolonged EEG may be helpful.  Clinical correlation is advised. ? ? ?Patrcia Dolly, M.D. ? ?

## 2021-10-23 ENCOUNTER — Other Ambulatory Visit (HOSPITAL_COMMUNITY): Payer: Self-pay | Admitting: Psychiatry

## 2021-10-23 DIAGNOSIS — F319 Bipolar disorder, unspecified: Secondary | ICD-10-CM

## 2021-10-23 DIAGNOSIS — F4310 Post-traumatic stress disorder, unspecified: Secondary | ICD-10-CM

## 2021-10-28 ENCOUNTER — Ambulatory Visit
Admission: RE | Admit: 2021-10-28 | Discharge: 2021-10-28 | Disposition: A | Discharge status: Home / Self Care | Payer: Medicaid Other | Source: Ambulatory Visit | Attending: Neurology | Admitting: Neurology

## 2021-10-28 DIAGNOSIS — G40309 Generalized idiopathic epilepsy and epileptic syndromes, not intractable, without status epilepticus: Secondary | ICD-10-CM

## 2021-10-28 MED ORDER — GADOBENATE DIMEGLUMINE 529 MG/ML IV SOLN
15.0000 mL | Freq: Once | INTRAVENOUS | Status: AC | PRN
  Administered 2021-10-28: 15 mL via INTRAVENOUS

## 2021-10-29 ENCOUNTER — Other Ambulatory Visit: Payer: Medicaid Other

## 2021-11-10 ENCOUNTER — Ambulatory Visit: Payer: Medicaid Other | Admitting: Neurology

## 2021-11-10 DIAGNOSIS — F445 Conversion disorder with seizures or convulsions: Secondary | ICD-10-CM

## 2021-11-10 DIAGNOSIS — G40309 Generalized idiopathic epilepsy and epileptic syndromes, not intractable, without status epilepticus: Secondary | ICD-10-CM | POA: Diagnosis not present

## 2021-11-18 NOTE — Procedures (Signed)
ELECTROENCEPHALOGRAM REPORT ? ?Dates of Recording: 11/10/2021 9:26AM to 11/12/2021 9:29AM ? ?Patient's Name: Rhonda Schroeder ?MRN: [id number] ?Date of Birth: 1990-09-11 ? ?Referring Provider: Dr. Ellouise Newer ? ?Procedure: 48-hour ambulatory video EEG ? ?History: This is a 31 year old woman with co-existing epilepsy and psychogenic non-epileptic events with a recent increase in seizures. EEG for classification. ? ?Medications:  ?KEPPRA 1000 MG tablet ?DEPAKOTE ?NEURONTIN 300 MG capsule ?ABILIFY 5 MG tablet ?STRATTERA 80 MG capsule ?COGENTIN 0.5 MG tablet ?EMGALITY 120 MG/ML SOSY ?ADVIL,MOTRIN 400 MG tablet ?PAMELOR 50 MG capsule ?VALTOCO 20 MG dose  2X10MG /0.1ML LQPK ? ? ?Technical Summary: ?This is a 48-hour multichannel digital video EEG recording measured by the international 10-20 system with electrodes applied with paste and impedances below 5000 ohms performed as portable with EKG monitoring.  The digital EEG was referentially recorded, reformatted, and digitally filtered in a variety of bipolar and referential montages for optimal display.   ? ?DESCRIPTION OF RECORDING: ?During maximal wakefulness, the background activity consisted of a symmetric 10 Hz posterior dominant rhythm which was reactive to eye opening.  There were no epileptiform discharges or focal slowing seen in wakefulness. ? ?During the recording, the patient progresses through wakefulness, drowsiness, and Stage 2 sleep.  Again, there were no epileptiform discharges seen. ? ?Events: ?There were no push button events. ? ?There were no electrographic seizures seen.  EKG lead was unremarkable. ? ? ?IMPRESSION: This 48-hour ambulatory video EEG study is normal.   ? ?CLINICAL CORRELATION: A normal EEG does not exclude a clinical diagnosis of epilepsy. Typical events were not captured. If further clinical questions remain, inpatient video EEG monitoring may be helpful. ? ? ?Ellouise Newer, M.D. ? ?

## 2021-11-19 ENCOUNTER — Encounter: Payer: Self-pay | Admitting: Neurology

## 2021-11-20 ENCOUNTER — Telehealth (HOSPITAL_BASED_OUTPATIENT_CLINIC_OR_DEPARTMENT_OTHER): Payer: Medicaid Other | Admitting: Psychiatry

## 2021-11-20 ENCOUNTER — Other Ambulatory Visit: Payer: Self-pay | Admitting: Neurology

## 2021-11-20 ENCOUNTER — Encounter (HOSPITAL_COMMUNITY): Payer: Self-pay | Admitting: Psychiatry

## 2021-11-20 DIAGNOSIS — F319 Bipolar disorder, unspecified: Secondary | ICD-10-CM

## 2021-11-20 DIAGNOSIS — F4310 Post-traumatic stress disorder, unspecified: Secondary | ICD-10-CM | POA: Diagnosis not present

## 2021-11-20 MED ORDER — ARIPIPRAZOLE 5 MG PO TABS: 5.0000 mg | ORAL_TABLET | Freq: Every day | ORAL | 2 refills | Status: DC

## 2021-11-20 MED ORDER — BENZTROPINE MESYLATE 0.5 MG PO TABS: 0.5000 mg | ORAL_TABLET | Freq: Every day | ORAL | 2 refills | Status: DC

## 2021-11-20 MED ORDER — NORTRIPTYLINE HCL 50 MG PO CAPS: 100.0000 mg | ORAL_CAPSULE | Freq: Every day | ORAL | 1 refills | Status: DC

## 2021-11-20 MED ORDER — NORTRIPTYLINE HCL 50 MG PO CAPS: 100.0000 mg | ORAL_CAPSULE | Freq: Every day | ORAL | 2 refills | Status: DC

## 2021-11-20 MED ORDER — ARIPIPRAZOLE 5 MG PO TABS: 5.0000 mg | ORAL_TABLET | Freq: Every day | ORAL | 0 refills | Status: DC

## 2021-11-20 MED ORDER — BENZTROPINE MESYLATE 0.5 MG PO TABS
0.5000 mg | ORAL_TABLET | Freq: Every day | ORAL | 0 refills | Status: DC
Start: 2021-11-20 — End: 2021-11-20
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
## 2021-11-24 ENCOUNTER — Encounter: Payer: Self-pay | Admitting: Neurology

## 2021-12-01 ENCOUNTER — Other Ambulatory Visit (HOSPITAL_COMMUNITY): Payer: Self-pay | Admitting: Psychiatry

## 2021-12-01 DIAGNOSIS — F900 Attention-deficit hyperactivity disorder, predominantly inattentive type: Secondary | ICD-10-CM

## 2021-12-04 ENCOUNTER — Telehealth (HOSPITAL_COMMUNITY): Payer: Self-pay

## 2021-12-04 DIAGNOSIS — F900 Attention-deficit hyperactivity disorder, predominantly inattentive type: Secondary | ICD-10-CM

## 2021-12-04 NOTE — Telephone Encounter (Signed)
Medication management - Telephone call with patient, after she left a message she would like Dr. Lolly Mustache to consider putting her back on Blase Mess now that she has returned to school. Informed Dr. Lolly Mustache would be out of the office until the coming week and patient stated that was "fine".  Agreed to send the message to Dr. Lolly Mustache for upon his return to let him know patient's request.  Patient returns for next appointment on 02/19/22 and was last seen 11/20/21.  Patient agreed to call back in the coming week if has not heard something back by 12/08/21 late afternoon.

## 2021-12-05 ENCOUNTER — Encounter: Payer: Self-pay | Admitting: Neurology

## 2021-12-08 ENCOUNTER — Other Ambulatory Visit (HOSPITAL_COMMUNITY): Payer: Self-pay | Admitting: *Deleted

## 2021-12-08 DIAGNOSIS — F900 Attention-deficit hyperactivity disorder, predominantly inattentive type: Secondary | ICD-10-CM

## 2021-12-08 MED ORDER — ATOMOXETINE HCL 40 MG PO CAPS: 40.0000 mg | ORAL_CAPSULE | Freq: Every day | ORAL | 0 refills | Status: DC

## 2021-12-08 NOTE — Telephone Encounter (Signed)
Medication management - Message left for pt. that Dr. Adele Schilder did agree to restart her Strattera medication, but at a lower dosage of 40 mg, one a day until she is seen.  Informed patient to call our office back if any questions or if an earlier appt needed.  Informed the new order was sent into her [facility] in [place], [place].

## 2021-12-08 NOTE — Telephone Encounter (Signed)
I will send Strattera 40 mg daily.  She used to take 80 mg but will start slow until she had appointment to discuss further about optimizing the dose.

## 2021-12-21 ENCOUNTER — Encounter: Payer: Self-pay | Admitting: Neurology

## 2021-12-27 ENCOUNTER — Encounter: Payer: Self-pay | Admitting: Neurology

## 2021-12-30 MED ORDER — LEVETIRACETAM 1000 MG PO TABS
ORAL_TABLET | ORAL | 3 refills | Status: DC
Start: 2021-12-30 — End: 2022-06-16

## 2022-01-02 ENCOUNTER — Telehealth (HOSPITAL_COMMUNITY): Payer: Self-pay

## 2022-01-02 NOTE — Telephone Encounter (Signed)
Medication management - Telephone call with patient, after she left a message that she feels she is having panic attacks since going out in public yesterday.  Reports she feels it is due to anxiety and stress and has been using music, breathing techniques, and taking medications as prescribed.  Patient reported she thought it might be better today as she keeps trying to breath trough it but continues to have tightness in her chest at times and just feels more anxious to the point she has been afraid she might have a seizure.  Patient reported no history of some heart issues and informed patient if shortness of breath, chest pains, anything like this to please also make sure she is following up for medical care and clearance.  Informed patient to call her PCP, or if emergent issue to go to her local ED or the [facility] located at [address]  Patient stated, by history she just feels this is anxiety driven and agreed to inform Dr. Lolly Mustache as patient wanted to know if he had any suggestions or something she could try to help. Reported the panic attacks are coming and going over the past 2 days but tries to continue to breath through those times to keep managing the worst times.  Agreed to send note to provider and pt agreed to seek out medical emergent care if any worsening of symptoms.

## 2022-01-02 NOTE — Telephone Encounter (Signed)
Medcation management -Telephone call with pt to provide Dr. Sheela Stack recommendations to stop her Strattera, to restart her Gabapentin from Dr. Karel Jarvis back to 2 a day as prescribed, and to increase her visits with therapists.  Patient stated understanding each of these instructions and agreed with plans.  Patient to call back if any other issues or if increased panic attacks persist.  Patient agreed to also seek emergent treatment if  any worsening medical concerns/issues with shortness of breath, chest pains or tightness, etc from a loca ED or [facility].

## 2022-01-02 NOTE — Telephone Encounter (Signed)
she need to stop Strattera and go back on gabapentin twice a day which is prescribed by other provider but she had cut down due to weight gain.  I also recommend to increase the visit with a therapist more frequently.

## 2022-01-07 ENCOUNTER — Encounter: Payer: Self-pay | Admitting: Neurology

## 2022-01-07 NOTE — Telephone Encounter (Signed)
Pt c/o: seizure Missed medications?  No. Sleep deprived?  Yes.   Alcohol intake?  No. Increased stress? Yes.   Any change in medication color or shape? Yes.   Back to their usual baseline self?  Yes.  .  Patient just woke up she has been asleep longer than usual. Patient has both her older daughters there with her today to keep an eye on her.  I advised patient if as she wakes up doesn't feel better or back to baseline to go to the ED If no, advise go to ER Current medications prescribed by Dr. Karel Jarvis: Rosetta Posner nasalayam

## 2022-01-08 MED ORDER — OXCARBAZEPINE 300 MG PO TABS: 300.0000 mg | ORAL_TABLET | Freq: Two times a day (BID) | ORAL | 5 refills | Status: DC

## 2022-01-09 ENCOUNTER — Other Ambulatory Visit: Payer: Self-pay

## 2022-01-09 DIAGNOSIS — G40309 Generalized idiopathic epilepsy and epileptic syndromes, not intractable, without status epilepticus: Secondary | ICD-10-CM

## 2022-01-12 ENCOUNTER — Telehealth (HOSPITAL_COMMUNITY): Payer: Medicaid Other | Admitting: Psychiatry

## 2022-01-14 ENCOUNTER — Other Ambulatory Visit (HOSPITAL_COMMUNITY): Payer: Self-pay | Admitting: Psychiatry

## 2022-01-14 ENCOUNTER — Encounter: Payer: Self-pay | Admitting: Neurology

## 2022-01-14 ENCOUNTER — Other Ambulatory Visit: Payer: Self-pay | Admitting: Neurology

## 2022-01-14 DIAGNOSIS — F319 Bipolar disorder, unspecified: Secondary | ICD-10-CM

## 2022-01-14 DIAGNOSIS — F4310 Post-traumatic stress disorder, unspecified: Secondary | ICD-10-CM

## 2022-01-16 ENCOUNTER — Ambulatory Visit: Payer: Medicaid Other | Admitting: Neurology

## 2022-01-19 ENCOUNTER — Inpatient Hospital Stay (HOSPITAL_COMMUNITY)
Admission: RE | Admit: 2022-01-19 | Discharge: 2022-01-23 | DRG: 101 | Disposition: A | Discharge status: Home / Self Care | Payer: Medicaid Other | Source: Ambulatory Visit | Attending: Neurology | Admitting: Neurology

## 2022-01-19 ENCOUNTER — Other Ambulatory Visit: Payer: Self-pay | Admitting: Neurology

## 2022-01-19 ENCOUNTER — Inpatient Hospital Stay (HOSPITAL_COMMUNITY): Payer: Medicaid Other

## 2022-01-19 DIAGNOSIS — R569 Unspecified convulsions: Secondary | ICD-10-CM | POA: Diagnosis not present

## 2022-01-19 DIAGNOSIS — F419 Anxiety disorder, unspecified: Secondary | ICD-10-CM | POA: Diagnosis present

## 2022-01-19 DIAGNOSIS — F32A Depression, unspecified: Secondary | ICD-10-CM | POA: Diagnosis present

## 2022-01-19 DIAGNOSIS — N200 Calculus of kidney: Secondary | ICD-10-CM | POA: Diagnosis present

## 2022-01-19 DIAGNOSIS — R251 Tremor, unspecified: Secondary | ICD-10-CM

## 2022-01-19 DIAGNOSIS — F17200 Nicotine dependence, unspecified, uncomplicated: Secondary | ICD-10-CM | POA: Diagnosis not present

## 2022-01-19 DIAGNOSIS — Z79899 Other long term (current) drug therapy: Secondary | ICD-10-CM | POA: Diagnosis not present

## 2022-01-19 DIAGNOSIS — G43711 Chronic migraine without aura, intractable, with status migrainosus: Secondary | ICD-10-CM | POA: Diagnosis not present

## 2022-01-19 DIAGNOSIS — G40919 Epilepsy, unspecified, intractable, without status epilepticus: Secondary | ICD-10-CM | POA: Diagnosis present

## 2022-01-19 DIAGNOSIS — G43909 Migraine, unspecified, not intractable, without status migrainosus: Secondary | ICD-10-CM | POA: Diagnosis present

## 2022-01-19 DIAGNOSIS — M791 Myalgia, unspecified site: Secondary | ICD-10-CM | POA: Diagnosis present

## 2022-01-19 DIAGNOSIS — E161 Other hypoglycemia: Secondary | ICD-10-CM | POA: Diagnosis present

## 2022-01-19 LAB — PROTIME-INR
INR: 1 (ref 0.8–1.2)
Prothrombin Time: 13.5 seconds (ref 11.4–15.2)

## 2022-01-19 LAB — HIV ANTIBODY (ROUTINE TESTING W REFLEX): HIV Screen 4th Generation wRfx: NONREACTIVE

## 2022-01-19 LAB — CBC WITH DIFFERENTIAL/PLATELET
Abs Immature Granulocytes: 0.01 10*3/uL (ref 0.00–0.07)
Basophils Absolute: 0 10*3/uL (ref 0.0–0.1)
Basophils Relative: 1 %
Eosinophils Absolute: 0.2 10*3/uL (ref 0.0–0.5)
Eosinophils Relative: 3 %
HCT: 32.2 % — ABNORMAL LOW (ref 36.0–46.0)
Hemoglobin: 10.6 g/dL — ABNORMAL LOW (ref 12.0–15.0)
Immature Granulocytes: 0 %
Lymphocytes Relative: 34 %
Lymphs Abs: 1.6 10*3/uL (ref 0.7–4.0)
MCH: 30.1 pg (ref 26.0–34.0)
MCHC: 32.9 g/dL (ref 30.0–36.0)
MCV: 91.5 fL (ref 80.0–100.0)
Monocytes Absolute: 0.5 10*3/uL (ref 0.1–1.0)
Monocytes Relative: 10 %
Neutro Abs: 2.5 10*3/uL (ref 1.7–7.7)
Neutrophils Relative %: 52 %
Platelets: 162 10*3/uL (ref 150–400)
RBC: 3.52 MIL/uL — ABNORMAL LOW (ref 3.87–5.11)
RDW: 12.6 % (ref 11.5–15.5)
WBC: 4.9 10*3/uL (ref 4.0–10.5)
nRBC: 0 % (ref 0.0–0.2)

## 2022-01-19 LAB — COMPREHENSIVE METABOLIC PANEL
ALT: 45 U/L — ABNORMAL HIGH (ref 0–44)
AST: 41 U/L (ref 15–41)
Albumin: 3.1 g/dL — ABNORMAL LOW (ref 3.5–5.0)
Alkaline Phosphatase: 37 U/L — ABNORMAL LOW (ref 38–126)
Anion gap: 12 (ref 5–15)
BUN: 16 mg/dL (ref 6–20)
CO2: 26 mmol/L (ref 22–32)
Calcium: 8.6 mg/dL — ABNORMAL LOW (ref 8.9–10.3)
Chloride: 103 mmol/L (ref 98–111)
Creatinine, Ser: 0.72 mg/dL (ref 0.44–1.00)
GFR, Estimated: >60 mL/min (ref >60)
Glucose, Bld: 90 mg/dL (ref 70–99)
Potassium: 4.4 mmol/L (ref 3.5–5.1)
Sodium: 141 mmol/L (ref 135–145)
Total Bilirubin: 0.2 mg/dL — ABNORMAL LOW (ref 0.3–1.2)
Total Protein: 5.4 g/dL — ABNORMAL LOW (ref 6.5–8.1)

## 2022-01-19 LAB — RAPID URINE DRUG SCREEN, HOSP PERFORMED
Amphetamines: NOT DETECTED
Barbiturates: NOT DETECTED
Benzodiazepines: NOT DETECTED
Cocaine: NOT DETECTED
Opiates: NOT DETECTED
Tetrahydrocannabinol: NOT DETECTED

## 2022-01-19 LAB — GLUCOSE, CAPILLARY
Glucose-Capillary: 102 mg/dL — ABNORMAL HIGH (ref 70–99)
Glucose-Capillary: 116 mg/dL — ABNORMAL HIGH (ref 70–99)
Glucose-Capillary: 102 mg/dL — ABNORMAL HIGH (ref 70–99)
Glucose-Capillary: 113 mg/dL — ABNORMAL HIGH (ref 70–99)
Glucose-Capillary: 84 mg/dL (ref 70–99)
Glucose-Capillary: 99 mg/dL (ref 70–99)

## 2022-01-19 LAB — PHOSPHORUS: Phosphorus: 4 mg/dL (ref 2.5–4.6)

## 2022-01-19 LAB — MAGNESIUM: Magnesium: 1.8 mg/dL (ref 1.7–2.4)

## 2022-01-19 MED ORDER — LORAZEPAM 2 MG/ML IJ SOLN
2.0000 mg | INTRAMUSCULAR | Status: DC | PRN
  Administered 2022-01-21: 2 mg via INTRAVENOUS
  Filled 2022-01-19: qty 1

## 2022-01-19 MED ORDER — ENOXAPARIN SODIUM 40 MG/0.4ML IJ SOSY
40.0000 mg | PREFILLED_SYRINGE | INTRAMUSCULAR | Status: DC
Start: 2022-01-19 — End: 2022-01-23
  Administered 2022-01-19 – 2022-01-23 (×5): 40 mg via SUBCUTANEOUS
  Filled 2022-01-19 (×5): qty 0.4

## 2022-01-19 MED ORDER — NICOTINE 14 MG/24HR TD PT24
14.0000 mg | MEDICATED_PATCH | Freq: Every day | TRANSDERMAL | Status: DC
Start: 2022-01-19 — End: 2022-01-23
  Administered 2022-01-19 – 2022-01-23 (×5): 14 mg via TRANSDERMAL
  Filled 2022-01-19 (×5): qty 1

## 2022-01-19 MED ORDER — KETOROLAC TROMETHAMINE 15 MG/ML IJ SOLN
15.0000 mg | Freq: Four times a day (QID) | INTRAMUSCULAR | Status: DC | PRN
  Administered 2022-01-19 – 2022-01-23 (×11): 15 mg via INTRAVENOUS
  Filled 2022-01-19 (×12): qty 1

## 2022-01-19 MED ORDER — LABETALOL HCL 5 MG/ML IV SOLN: 5.0000 mg | INTRAVENOUS | Status: DC | PRN

## 2022-01-19 MED ORDER — BENZTROPINE MESYLATE 0.5 MG PO TABS
0.5000 mg | ORAL_TABLET | Freq: Every day | ORAL | Status: DC
  Administered 2022-01-19 – 2022-01-22 (×4): 0.5 mg via ORAL
  Filled 2022-01-19 (×5): qty 1

## 2022-01-19 MED ORDER — ARIPIPRAZOLE 10 MG PO TABS
5.0000 mg | ORAL_TABLET | Freq: Every day | ORAL | Status: DC
  Administered 2022-01-19 – 2022-01-22 (×4): 5 mg via ORAL
  Filled 2022-01-19 (×4): qty 1

## 2022-01-19 MED ORDER — NORTRIPTYLINE HCL 25 MG PO CAPS
100.0000 mg | ORAL_CAPSULE | Freq: Every day | ORAL | Status: DC
  Administered 2022-01-19 – 2022-01-22 (×4): 100 mg via ORAL
  Filled 2022-01-19 (×2): qty 10
  Filled 2022-01-19 (×4): qty 4

## 2022-01-19 MED ORDER — ACETAMINOPHEN 325 MG PO TABS
650.0000 mg | ORAL_TABLET | ORAL | Status: DC | PRN
  Administered 2022-01-20 – 2022-01-22 (×5): 650 mg via ORAL
  Filled 2022-01-19 (×5): qty 2

## 2022-01-19 MED ORDER — ACETAMINOPHEN 650 MG RE SUPP: 650.0000 mg | RECTAL | Status: DC | PRN

## 2022-01-19 NOTE — Progress Notes (Signed)
  Transition of Care [facility]) Screening Note   Patient Details  Name: Rhonda Schroeder Date of Birth: December 25, 1990   Transition of Care [facility]) CM/SW Contact:    Kermit Balo, RN Phone Number: 01/19/2022, 12:57 PM    Transition of Care Department [facility]) has reviewed patient and no TOC needs have been identified at this time. We will continue to monitor patient advancement through interdisciplinary progression rounds. If new patient transition needs arise, please place a TOC consult.

## 2022-01-19 NOTE — Progress Notes (Signed)
LTM EEG hooked up and running - no initial skin breakdown - push button tested - neuro notified. [facility] monitoring.  

## 2022-01-19 NOTE — H&P (Signed)
CC: Seizure  History is obtained from: Patient, chart review  HPI: Rhonda Schroeder is a 31 y.o. female with history of seizures, chronic migraine, anxiety, depression, renal calculi who is admitted to epilepsy monitoring unit for characterization of seizure-like episodes.  History of spells: Per mother, patient had reported seizure around age 63.  Episode started staring off followed by whole body jerking.  States she was started on Depakote and Tegretol and episodes were relatively well controlled (maybe 1 episode a year).  At around age 20, her seizures stopped until age 17 when they recurred.  Between age 47 until about 2014, patient was on antiepileptic drug but cannot remember the name and had relatively "well-controlled" seizures described as maybe 1-2 seizures every year.  States she got married in 2014 and had only one seizure between 2014 to 2019.  In 2019 she started having seizures related to stress.  States all her epileptic seizures were generalized tonic-clonic and she did not have any aura, denies incontinence but does report tongue bite on the right side maybe twice, denies any memory of the episodes except myalgia after the event.  However with her nonepileptic events, states she would get a warning and not feeling well, would usually sit down, had whole body twitching , was able to hear people but could not respond and after the event she would know that she just had an event.  Her husband passed away in April 06, 2023and since then her episodes have significantly worsened.  Mother describes current episodes as gaze deviation to the left followed by head deviation to the left as well as whole body jerking lasting for 1 to 2 minutes followed by significant headache and myalgias.  Episodes are not happening on an average once every month, most recent episode was on July 4 when she had 2 events in the same day.  Patient reports being on Keppra, Depakote and Topamax in the past .  However, Topamax  was started due to renal calculi.  Currently on Keppra, Depakote, gabapentin and oxcarbazepine without significant improvement in seizure frequency.  Does report excessive sedation since being on oxcarbazepine  Of note patient also reports being diagnosed with reactive hypoglycemia in the last couple of months with settling of blood sugar dropped between 20-50.  She is seeing someone at [facility] for this and has been referred to [facility] endocrinology.  Epilepsy risk factors: Reports normal birth and development, denies febrile seizures, denies meningitis/encephalitis, denies brain injury, neurosurgical interventions.  Reports history of seizures in paternal uncle.  Prior routine and ambulatory EEGs have been within normal limits. However, Per mother, patient had an EEG done when she initially had seizures and was told it showed some abnormality on the left side.  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   ROS: All other systems reviewed and negative except as noted in the HPI.   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 She has never used smokeless tobacco. She reports that she does not currently use alcohol. She reports that she does not use drugs.   Medications Prior to Admission  Medication Sig Dispense Refill Last Dose   ARIPiprazole (ABILIFY) 5 MG tablet Take 1 tablet (5 mg total) by mouth daily. 30 tablet 2    atomoxetine (STRATTERA) 40 MG capsule Take 1 capsule (40 mg  total) by mouth daily. 30 capsule 0    BAQSIMI ONE PACK 3 MG/DOSE POWD Place 1 spray into both nostrils as needed for low blood sugar.      benztropine (COGENTIN) 0.5 MG tablet Take 1 tablet (0.5 mg total) by mouth at bedtime. 30 tablet 2    diazePAM, 20 MG Dose, (VALTOCO 20 MG DOSE) 2 x 10 MG/0.1ML LQPK Spray in each nostril as instructed for seizure rescue. May give second dose after 4 hours if needed. 10 each 5    divalproex (DEPAKOTE ER) 500 MG 24 hr tablet Take 500 mg by mouth 2 (two) times daily.      gabapentin (NEURONTIN) 300 MG capsule TAKE 1 CAPSULE BY MOUTH TWICE A DAY (Patient taking differently: Take 300 mg by mouth 2 (two) times daily.) 60 capsule 0    Galcanezumab-gnlm (EMGALITY) 120 MG/ML SOSY Inject 1 Dose into the skin every 30 (thirty) days. 1.12 mL 11    levETIRAcetam (KEPPRA) 1000 MG tablet Take 1 and 1/2 tablets twice a day (Patient taking differently: Take 1,000 mg by mouth 2 (two) times daily.) 270 tablet 3    meloxicam (MOBIC) 15 MG tablet Take 15 mg by mouth daily.      nortriptyline (PAMELOR) 50 MG capsule Take 2 capsules (100 mg total) by mouth at bedtime. 60 capsule 2    Oxcarbazepine (TRILEPTAL) 300 MG tablet Take 1 tablet (300 mg total) by mouth 2 (two) times daily. 60 tablet 5      Exam: Current vital signs: BP 124/66 (BP Location: Right Arm)   Pulse 87   Resp 18   SpO2 99%  Vital signs in last 24 hours: Pulse Rate:  [87] 87 (07/17 0820) Resp:  [18] 18 (07/17 0820) BP: (124)/(66) 124/66 (07/17 0820) SpO2:  [99 %] 99 % (07/17 0820)   Physical Exam  Constitutional: Appears well-developed and well-nourished.  Psych: Affect appropriate to situation Eyes: No scleral injection HENT: No OP obstrucion Head: Normocephalic.  Cardiovascular: Normal rate and regular rhythm.  Respiratory: Effort normal, non-labored breathing GI: Soft.  No distension. There is no tenderness.  Skin: Warm Neuro: AOx3, cranial nerves II to XII grossly intact, 5/5 in all  extremities   I have reviewed labs in epic and the results pertinent to this consultation are: CBC: No results for input(s): "WBC", "NEUTROABS", "HGB", "HCT", "MCV", "PLT" in the last 168 hours.  Basic Metabolic Panel:  Lab Results  Component Value Date   NA 143 08/18/2021   K 3.9 08/18/2021   CO2 20 08/18/2021   GLUCOSE 109 (H) 08/18/2021   BUN 7 08/18/2021   CREATININE 0.80 08/18/2021   CALCIUM 8.9 08/18/2021   GFRNONAA >60 09/16/2018   GFRAA >60 09/16/2018   Lipid Panel: No results found for: "LDLCALC" HgbA1c:  Lab Results  Component Value Date   HGBA1C 5.0 08/18/2021   Urine Drug Screen:     Component Value Date/Time   LABOPIA NONE DETECTED 09/16/2018 0256   COCAINSCRNUR NONE DETECTED 09/16/2018 0256   LABBENZ NONE DETECTED 09/16/2018 0256  AMPHETMU NONE DETECTED 09/16/2018 0256   THCU NONE DETECTED 09/16/2018 0256   LABBARB NONE DETECTED 09/16/2018 0256    Alcohol Level     Component Value Date/Time   ETH <10 09/16/2018 6834     I have reviewed the images obtained:  MRI brain with and without contrast 10/28/2021: Developmental venous anomaly left occipital lobe unchanged from prior MRI. Otherwise normal MRI of the brain. No acute abnormality.  ASSESSMENT/PLAN: 31 year old female with history of epilepsy and nonepileptic events admitted to  [facility] for characterization of seizure-like activity EEG.  Seizures -Start video EEG monitoring for characterization of spells -Will hold antiepileptic drugs  -As needed IV Ativan 2 mg for clinical seizure-like activity -Continue seizure precautions  Anxiety Depression -Continue home medications  Tremors -Continue home benztropine  Reactive hypoglycemia -Patient reports every 1-2 hours fingersticks.  Will order.  Snacks at bedside  Lindie Spruce Epilepsy [facility]

## 2022-01-20 DIAGNOSIS — R569 Unspecified convulsions: Secondary | ICD-10-CM | POA: Diagnosis not present

## 2022-01-20 LAB — GLUCOSE, CAPILLARY
Glucose-Capillary: 103 mg/dL — ABNORMAL HIGH (ref 70–99)
Glucose-Capillary: 108 mg/dL — ABNORMAL HIGH (ref 70–99)
Glucose-Capillary: 116 mg/dL — ABNORMAL HIGH (ref 70–99)
Glucose-Capillary: 121 mg/dL — ABNORMAL HIGH (ref 70–99)
Glucose-Capillary: 133 mg/dL — ABNORMAL HIGH (ref 70–99)
Glucose-Capillary: 138 mg/dL — ABNORMAL HIGH (ref 70–99)
Glucose-Capillary: 73 mg/dL (ref 70–99)
Glucose-Capillary: 83 mg/dL (ref 70–99)
Glucose-Capillary: 91 mg/dL (ref 70–99)
Glucose-Capillary: 93 mg/dL (ref 70–99)
Glucose-Capillary: 95 mg/dL (ref 70–99)
Glucose-Capillary: 96 mg/dL (ref 70–99)

## 2022-01-20 MED ORDER — HYDROXYZINE HCL 25 MG PO TABS
25.0000 mg | ORAL_TABLET | Freq: Three times a day (TID) | ORAL | Status: DC | PRN
  Administered 2022-01-20: 25 mg via ORAL
  Filled 2022-01-20: qty 1

## 2022-01-20 NOTE — Progress Notes (Signed)
LTM maint complete - no skin breakdown Patient awake, all leads in place.

## 2022-01-20 NOTE — Progress Notes (Signed)
vLTM maintenance   All impedances below 10kohms.  No skin breakdown noted at all skin sites 

## 2022-01-20 NOTE — Progress Notes (Signed)
Subjective: No events overnight.  Denies any new concerns.  ROS: negative except above  Examination  Vital signs in last 24 hours: Temp:  [97.8 F (36.6 C)-98.7 F (37.1 C)] 98.3 F (36.8 C) (07/18 1124) Pulse Rate:  [70-92] 90 (07/18 1124) Resp:  [18-20] 20 (07/18 1124) BP: (111-121)/(50-69) 115/55 (07/18 1124) SpO2:  [94 %-100 %] 94 % (07/18 1124)  General: lying in bed, NAD  CVS: pulse-normal rate and rhythm RS: breathing comfortably Extremities: normal   Neuro: MS: Alert, oriented, follows commands CN: pupils equal and reactive,  EOMI, face symmetric, tongue midline, normal sensation over face, Motor: 5/5 strength in all 4 extremities   Basic Metabolic Panel: Recent Labs  Lab 01/19/22 1006  NA 141  K 4.4  CL 103  CO2 26  GLUCOSE 90  BUN 16  CREATININE 0.72  CALCIUM 8.6*  MG 1.8  PHOS 4.0    CBC: Recent Labs  Lab 01/19/22 1006  WBC 4.9  NEUTROABS 2.5  HGB 10.6*  HCT 32.2*  MCV 91.5  PLT 162     Coagulation Studies: Recent Labs    01/19/22 1006  LABPROT 13.5  INR 1.0    Imaging No new brain imaging overnight  ASSESSMENT AND PLAN:  31 year old female with history of epilepsy and nonepileptic events admitted to  [facility] for characterization of seizure-like activity EEG.   Seizures -Continue video EEG monitoring for characterization of spells -Continue to hold antiepileptic drugs  -Hyperventilation, photic stimulation as well as sleep deprivation today for seizure provocation -Discussed overnight EEG findings with patient and mother -As needed IV Ativan 2 mg for clinical seizure-like activity -Continue seizure precautions   Anxiety Depression -Continue home medications   Tremors -Continue home benztropine   Reactive hypoglycemia -Patient reports every 1-2 hours fingersticks.  Will order.  Snacks at bedside  Nicotine use disorder -Nicotine patch ordered    I have spent a total of  36  minutes with the patient reviewing hospital  notes,  test results, labs and examining the patient as well as establishing an assessment and plan that was discussed personally with the patient at bedside and mother informed.  > 50% of time was spent in direct patient care.   Lindie Spruce Epilepsy [facility] For questions after 5pm please refer to AMION to reach the Neurologist on call

## 2022-01-20 NOTE — Procedures (Signed)
Patient Name: Rhonda Schroeder  MRN: [id number]  Epilepsy Attending: Charlsie Quest  Referring Physician/Provider: Charlsie Quest, MD Duration: 01/19/2022 6387 to 01/20/2022 5643  Patient history: 31 year old female with history of epilepsy and nonepileptic events admitted to  EMU for characterization of seizure-like activity EEG.  Level of alertness: Awake, asleep  AEDs during EEG study: LEV, VPA, OXC, GBP  Technical aspects: This EEG study was done with scalp electrodes positioned according to the 10-20 International system of electrode placement. Electrical activity was acquired at a sampling rate of 500Hz  and reviewed with a high frequency filter of 70Hz  and a low frequency filter of 1Hz . EEG data were recorded continuously and digitally stored.   Description: The posterior dominant rhythm consists of 7.5Hz  activity of moderate voltage (25-35 uV) seen predominantly in posterior head regions, symmetric and reactive to eye opening and eye closing. Sleep was characterized by vertex waves, sleep spindles (12 to 14 Hz), maximal frontocentral region. Hyperventilation and photic stimulation were not performed.     IMPRESSION: This study is within normal limits. No seizures or epileptiform discharges were seen throughout the recording.  [name] 

## 2022-01-21 DIAGNOSIS — R569 Unspecified convulsions: Secondary | ICD-10-CM | POA: Diagnosis not present

## 2022-01-21 LAB — GLUCOSE, CAPILLARY
Glucose-Capillary: 110 mg/dL — ABNORMAL HIGH (ref 70–99)
Glucose-Capillary: 94 mg/dL (ref 70–99)
Glucose-Capillary: 96 mg/dL (ref 70–99)
Glucose-Capillary: 138 mg/dL — ABNORMAL HIGH (ref 70–99)
Glucose-Capillary: 178 mg/dL — ABNORMAL HIGH (ref 70–99)
Glucose-Capillary: 62 mg/dL — ABNORMAL LOW (ref 70–99)
Glucose-Capillary: 104 mg/dL — ABNORMAL HIGH (ref 70–99)
Glucose-Capillary: 109 mg/dL — ABNORMAL HIGH (ref 70–99)
Glucose-Capillary: 99 mg/dL (ref 70–99)

## 2022-01-21 MED ORDER — DIVALPROEX SODIUM 250 MG PO DR TAB
250.0000 mg | DELAYED_RELEASE_TABLET | Freq: Two times a day (BID) | ORAL | Status: DC
  Administered 2022-01-21 – 2022-01-22 (×3): 250 mg via ORAL
  Filled 2022-01-21 (×3): qty 1

## 2022-01-21 MED ORDER — LEVETIRACETAM IN NACL 1000 MG/100ML IV SOLN
1000.0000 mg | INTRAVENOUS | Status: AC
Start: 2022-01-21 — End: 2022-01-21
  Administered 2022-01-21: 1000 mg via INTRAVENOUS

## 2022-01-21 MED ORDER — OXCARBAZEPINE 300 MG PO TABS
300.0000 mg | ORAL_TABLET | Freq: Two times a day (BID) | ORAL | Status: DC
  Administered 2022-01-21 – 2022-01-22 (×3): 300 mg via ORAL
  Filled 2022-01-21 (×3): qty 1

## 2022-01-21 MED ORDER — LEVETIRACETAM 500 MG PO TABS
1000.0000 mg | ORAL_TABLET | Freq: Two times a day (BID) | ORAL | Status: DC
  Administered 2022-01-21 – 2022-01-23 (×4): 1000 mg via ORAL
  Filled 2022-01-21 (×4): qty 2

## 2022-01-21 MED ORDER — LEVETIRACETAM IN NACL 1000 MG/100ML IV SOLN
INTRAVENOUS | Status: AC
  Filled 2022-01-21: qty 100

## 2022-01-21 MED ORDER — LEVETIRACETAM IN NACL 1000 MG/100ML IV SOLN
1000.0000 mg | INTRAVENOUS | Status: AC
Start: 2022-01-21 — End: 2022-01-21
  Administered 2022-01-21: 1000 mg via INTRAVENOUS
  Filled 2022-01-21: qty 100

## 2022-01-21 MED ORDER — GABAPENTIN 300 MG PO CAPS
300.0000 mg | ORAL_CAPSULE | Freq: Two times a day (BID) | ORAL | Status: DC
  Administered 2022-01-21 – 2022-01-23 (×5): 300 mg via ORAL
  Filled 2022-01-21 (×5): qty 1

## 2022-01-21 NOTE — Progress Notes (Signed)
LTM maint complete - no skin breakdown under:  FP2 FP1

## 2022-01-21 NOTE — Progress Notes (Signed)
Subjective: Had 2 typical events overnight, confirmed seizures on EEG. Currently reporting myalgia. No other concerns.  ROS: negative except above  Examination  Vital signs in last 24 hours: Temp:  [97.7 F (36.5 C)-98.7 F (37.1 C)] 98 F (36.7 C) (07/19 1153) Pulse Rate:  [61-102] 93 (07/19 1153) Resp:  [17-20] 18 (07/19 1153) BP: (109-181)/(58-91) 135/80 (07/19 1153) SpO2:  [75 %-100 %] 97 % (07/19 1153)  General: lying in bed, NAD CVS: pulse-normal rate and rhythm RS: breathing comfortably Extremities: normal   Neuro: MS: Alert, oriented, follows commands CN: pupils equal and reactive,  EOMI, face symmetric, tongue midline, normal sensation over face, Motor: 5/5 strength in all 4 extremities Coordination: normal Gait: not tested  Basic Metabolic Panel: Recent Labs  Lab 01/19/22 1006  NA 141  K 4.4  CL 103  CO2 26  GLUCOSE 90  BUN 16  CREATININE 0.72  CALCIUM 8.6*  MG 1.8  PHOS 4.0    CBC: Recent Labs  Lab 01/19/22 1006  WBC 4.9  NEUTROABS 2.5  HGB 10.6*  HCT 32.2*  MCV 91.5  PLT 162     Coagulation Studies: Recent Labs    01/19/22 1006  LABPROT 13.5  INR 1.0    Imaging No new brain imaging overnight   ASSESSMENT AND PLAN:  31 year old female with history of epilepsy and nonepileptic events admitted to  [facility] for characterization of seizure-like activity EEG.   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  Will order.  Snacks at bedside   Nicotine use disorder -Nicotine  patch     I have spent a total of  40 minutes with the patient reviewing hospital notes,  test results, labs and examining the patient as well as establishing an assessment and plan that was discussed personally with the patient at bedside and mother informed.  > 50% of time was spent in direct patient care.   Epilepsy [facility] For questions after 5pm please refer to AMION to reach the Neurologist on call

## 2022-01-21 NOTE — Progress Notes (Signed)
Pt had a seizure like episode at 0414 with the whole body jerking and twiching, eyes rolling to the left side and mouth dripping saliva, pt was just assisted back to bed from using the bathroom by the NT when the event started, pt turned sideways and mouth suctioned accordingly. Event lasted for about , pt could not respond to any question, could not follow command either, was just gazing thereafter. Dr Derry Lory paged to be notified, yet to call back, pt later responded appropriately to questions after about , denies any pain. Pt has been awake all night per order and was about to get some sleep after using the bathroom when event occurred, pt was however reassured and will continue to monitor. Rhonda Schroeder, Rhonda Schroeder

## 2022-01-21 NOTE — Progress Notes (Signed)
vLTM maintenance all impedances below 10kohms.  No skin breakdown noted at all skin sites 

## 2022-01-21 NOTE — Procedures (Signed)
Patient Name: Rhonda Schroeder  MRN: [id number]  Epilepsy Attending: Charlsie Quest  Referring Physician/Provider: Charlsie Quest, MD Duration: 01/20/2022 3710 to 01/21/2022 0835   Patient history: 31 year old female with history of epilepsy and nonepileptic events admitted to  EMU for characterization of seizure-like activity EEG.   Level of alertness: Awake, asleep   AEDs during EEG study: LEV, VPA, OXC, GBP   Technical aspects: This EEG study was done with scalp electrodes positioned according to the 10-20 International system of electrode placement. Electrical activity was acquired at a sampling rate of 500Hz  and reviewed with a high frequency filter of 70Hz  and a low frequency filter of 1Hz . EEG data were recorded continuously and digitally stored.    Description: The posterior dominant rhythm consists of 7.5Hz  activity of moderate voltage (25-35 uV) seen predominantly in posterior head regions, symmetric and reactive to eye opening and eye closing. Sleep was characterized by vertex waves, sleep spindles (12 to 14 Hz), maximal frontocentral region.  Physiologic photic driving was not seen during photic stimulation.  Generalized 5 to 7 Hz theta slowing was seen during hyperventilation.  Event button was pressed on 01/21/2022 at 0410 . During the event patient initially dint have any clinical signs. This was followed by forced right neck deviation. She then had whole body generalized tonic -clonic jerking. Concomitant EEG showed generalized 4.5-5hz  theta slowing It then evolved into 9-10hz  alpha activity followed by 15-16hz  beta activity. EEG then showed generalized 2-2.5hz  spike and wave with significant overlying myogenic artifact. Seizure lasted for about 2 minutes followed by generalized eeg attenuation.   Event button was pressed on 01/21/2022 at 0824 . During the event patient initially dint have any clinical signs. This was followed by behavioral arrest. Patient then had right gaze  deviation ( didn't appear forced) followed by forced right neck deviation. She then had whole body stiffening and eventually generalized tonic -clonic jerking. Concomitant EEG showed left frontotemporal 4.5-5hz  theta slowing which involved right hemisphere within 1-2 seconds. It then evolved into generalized, maximal F3-F4 9-10hz  alpha activity followed by 15-16hz  beta activity. EEG then showed generalized 2-2.5hz  spike and wave with significant overlying myogenic artifact. Seizure lasted for about 2 minutes followed by generalized eeg attenuation.   ABNORMALITY - Seizure, left frontotemporal  IMPRESSION: This study showed two seizures as described above. He first seizure recorded on 01/21/2022 at 0410 appeared to have generalized onset. However, during the second seizure at 0824, there was left frontotemporal onset which spread to involve right hemisphere within 1-2 seconds. 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 However, generalized epilepsy or independent bilateral frontotemporal epilepsy cannot be completely excluded yet.   Guila Owensby 01/23/2022

## 2022-01-22 LAB — BASIC METABOLIC PANEL
Anion gap: 9 (ref 5–15)
BUN: 15 mg/dL (ref 6–20)
CO2: 26 mmol/L (ref 22–32)
Calcium: 8.3 mg/dL — ABNORMAL LOW (ref 8.9–10.3)
Chloride: 105 mmol/L (ref 98–111)
Creatinine, Ser: 0.74 mg/dL (ref 0.44–1.00)
GFR, Estimated: >60 mL/min (ref >60)
Glucose, Bld: 99 mg/dL (ref 70–99)
Potassium: 4.9 mmol/L (ref 3.5–5.1)
Sodium: 140 mmol/L (ref 135–145)

## 2022-01-22 LAB — CK: Total CK: 59 U/L (ref 38–234)

## 2022-01-22 LAB — GLUCOSE, CAPILLARY
Glucose-Capillary: 52 mg/dL — ABNORMAL LOW (ref 70–99)
Glucose-Capillary: 57 mg/dL — ABNORMAL LOW (ref 70–99)
Glucose-Capillary: 61 mg/dL — ABNORMAL LOW (ref 70–99)
Glucose-Capillary: 128 mg/dL — ABNORMAL HIGH (ref 70–99)
Glucose-Capillary: 82 mg/dL (ref 70–99)
Glucose-Capillary: 110 mg/dL — ABNORMAL HIGH (ref 70–99)
Glucose-Capillary: 105 mg/dL — ABNORMAL HIGH (ref 70–99)
Glucose-Capillary: 109 mg/dL — ABNORMAL HIGH (ref 70–99)
Glucose-Capillary: 104 mg/dL — ABNORMAL HIGH (ref 70–99)
Glucose-Capillary: 110 mg/dL — ABNORMAL HIGH (ref 70–99)

## 2022-01-22 MED ORDER — OXCARBAZEPINE 300 MG PO TABS
300.0000 mg | ORAL_TABLET | Freq: Every day | ORAL | Status: DC
  Administered 2022-01-23: 300 mg via ORAL
  Filled 2022-01-22: qty 1

## 2022-01-22 MED ORDER — MORPHINE SULFATE (PF) 2 MG/ML IV SOLN
2.0000 mg | Freq: Once | INTRAVENOUS | Status: AC
  Administered 2022-01-22: 2 mg via INTRAVENOUS
  Filled 2022-01-22: qty 1

## 2022-01-22 MED ORDER — OXCARBAZEPINE 300 MG PO TABS
450.0000 mg | ORAL_TABLET | Freq: Every day | ORAL | Status: DC
  Administered 2022-01-22: 450 mg via ORAL
  Filled 2022-01-22 (×2): qty 1

## 2022-01-22 MED ORDER — DIVALPROEX SODIUM ER 500 MG PO TB24
500.0000 mg | ORAL_TABLET | Freq: Two times a day (BID) | ORAL | Status: DC
  Administered 2022-01-22 – 2022-01-23 (×2): 500 mg via ORAL
  Filled 2022-01-22 (×2): qty 1

## 2022-01-22 NOTE — Progress Notes (Signed)
Pt had a quiet night rest until this morning when her CBG read 57 at 0630, juice and crackers given and CBG repeated, read 52, more juice and crackers given, came up to 61 at 0732, Ensure drink given and breakfast ordered as well, Dr Melynda Ripple messaged via Secure chart and included the on coming RN Janique, pt reassured. Obasogie-Asidi, Carthel Castille Efe

## 2022-01-22 NOTE — Procedures (Addendum)
Patient Name: Rhonda Schroeder  MRN: [id number]  Epilepsy Attending: Charlsie Quest  Referring Physician/Provider: Charlsie Quest, MD Duration: 01/21/2022 3343 to 01/22/2022 5686   Patient history: 31 year old female with history of epilepsy and nonepileptic events admitted to  EMU for characterization of seizure-like activity EEG.   Level of alertness: Awake, asleep   AEDs during EEG study: LEV, VPA, OXC, GBP   Technical aspects: This EEG study was done with scalp electrodes positioned according to the 10-20 International system of electrode placement. Electrical activity was acquired at a sampling rate of 500Hz  and reviewed with a high frequency filter of 70Hz  and a low frequency filter of 1Hz . EEG data were recorded continuously and digitally stored.    Description: The posterior dominant rhythm consists of 7.5Hz  activity of moderate voltage (25-35 uV) seen predominantly in posterior head regions, symmetric and reactive to eye opening and eye closing. Sleep was characterized by vertex waves, sleep spindles (12 to 14 Hz), maximal frontocentral region.  EEG showed intermittent generalized and lateralized left hemisphere 3 to 5 Hz theta-delta slowing.  ABNORMALITY -Intermittent slow, generalized and lateralized left hemisphere  IMPRESSION: This study is suggestive of cortical dysfunction in left hemisphere as well as mild diffuse encephalopathy. No seizures or epileptiform discharges were seen during the study.  Cady Hafen 

## 2022-01-22 NOTE — Progress Notes (Signed)
vLTM maintenance   All impedances below 10kohms.  No skin breakdown noted at all skin sites 

## 2022-01-22 NOTE — Progress Notes (Addendum)
Subjective: NAEO. Complaining of diffuse myalgias.   ROS: negative except above  Examination  Vital signs in last 24 hours: Temp:  [98 F (36.7 C)-99.1 F (37.3 C)] 98 F (36.7 C) (07/20 1157) Pulse Rate:  [83-100] 100 (07/20 1157) Resp:  [14-20] 14 (07/20 1157) BP: (90-119)/(47-68) 116/59 (07/20 1157) SpO2:  [95 %-100 %] 100 % (07/20 1157)  General: lying in bed, NAD CVS: pulse-normal rate and rhythm RS: breathing comfortably Extremities: normal    Neuro: MS: Alert, oriented, follows commands CN: pupils equal and reactive,  EOMI, face symmetric, tongue midline, normal sensation over face, Motor: 5/5 strength in all 4 extremities Coordination: normal Gait: not tested  Basic Metabolic Panel: Recent Labs  Lab 01/19/22 1006 01/22/22 1020  NA 141 140  K 4.4 4.9  CL 103 105  CO2 26 26  GLUCOSE 90 99  BUN 16 15  CREATININE 0.72 0.74  CALCIUM 8.6* 8.3*  MG 1.8  --   PHOS 4.0  --     CBC: Recent Labs  Lab 01/19/22 1006  WBC 4.9  NEUTROABS 2.5  HGB 10.6*  HCT 32.2*  MCV 91.5  PLT 162     Coagulation Studies: No results for input(s): "LABPROT", "INR" in the last 72 hours.  Imaging No new brain imaging overnight   ASSESSMENT AND PLAN:  31 year old female with history of epilepsy and nonepileptic events admitted to  [facility] for characterization of seizure-like activity EEG.   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  Will order.  Snacks at bedside   Nicotine use disorder -Nicotine patch     I have  spent a total of  with the patient reviewing hospital notes,  test results, labs and examining the patient as well as establishing an assessment and plan that was discussed personally with the patient at bedside and mother informed.  > 50% of time was spent in direct patient care.     Lindie Spruce Epilepsy [facility] For questions after 5pm please refer to AMION to reach the Neurologist on call

## 2022-01-23 ENCOUNTER — Other Ambulatory Visit (HOSPITAL_COMMUNITY): Payer: Self-pay

## 2022-01-23 ENCOUNTER — Encounter: Payer: Self-pay | Admitting: Neurology

## 2022-01-23 DIAGNOSIS — E161 Other hypoglycemia: Secondary | ICD-10-CM

## 2022-01-23 DIAGNOSIS — G40919 Epilepsy, unspecified, intractable, without status epilepticus: Principal | ICD-10-CM

## 2022-01-23 DIAGNOSIS — R251 Tremor, unspecified: Secondary | ICD-10-CM

## 2022-01-23 DIAGNOSIS — F17200 Nicotine dependence, unspecified, uncomplicated: Secondary | ICD-10-CM

## 2022-01-23 DIAGNOSIS — G43711 Chronic migraine without aura, intractable, with status migrainosus: Secondary | ICD-10-CM

## 2022-01-23 DIAGNOSIS — F419 Anxiety disorder, unspecified: Secondary | ICD-10-CM

## 2022-01-23 DIAGNOSIS — F32A Depression, unspecified: Secondary | ICD-10-CM

## 2022-01-23 LAB — GLUCOSE, CAPILLARY
Glucose-Capillary: 99 mg/dL (ref 70–99)
Glucose-Capillary: 83 mg/dL (ref 70–99)
Glucose-Capillary: 64 mg/dL — ABNORMAL LOW (ref 70–99)

## 2022-01-23 MED ORDER — OXCARBAZEPINE 150 MG PO TABS
450.0000 mg | ORAL_TABLET | Freq: Every day | ORAL | 1 refills | Status: DC
  Filled 2022-01-23: qty 90, 30d supply, fill #0

## 2022-01-23 MED ORDER — OXCARBAZEPINE 300 MG PO TABS
300.0000 mg | ORAL_TABLET | Freq: Every day | ORAL | 1 refills | Status: DC
  Filled 2022-01-23: qty 30, 30d supply, fill #0

## 2022-01-23 NOTE — Progress Notes (Signed)
LTM EEG discontinued - Pt had skin breakdown at electrode site T5 T6

## 2022-01-23 NOTE — TOC Transition Note (Signed)
Transition of Care [facility]) - CM/SW Discharge Note   Patient Details  Name: Rhonda Schroeder MRN: [id number] Date of Birth: December 24, 1990  Transition of Care [facility] CM/SW Contact:  Leone Haven, RN Phone Number: 01/23/2022, 10:28 AM   Clinical Narrative:    Patient for dc today, she has transport at discharge.  She states she would like to be taken down to car in a w/chair.           Patient Goals and CMS Choice        Discharge Placement                       Discharge Plan and Services                                     Social Determinants of Health (SDOH) Interventions     Readmission Risk Interventions     No data to display

## 2022-01-23 NOTE — Procedures (Addendum)
Patient Name: Rhonda Schroeder  MRN: [id number]  Epilepsy Attending: Charlsie Quest  Referring Physician/Provider: Charlsie Quest, MD Duration: 01/22/2022 5038 to 01/23/2022 8828   Patient history: 31 year old female with history of epilepsy and nonepileptic events admitted to  EMU for characterization of seizure-like activity EEG.   Level of alertness: Awake, asleep   AEDs during EEG study: LEV, VPA, OXC, GBP   Technical aspects: This EEG study was done with scalp electrodes positioned according to the 10-20 International system of electrode placement. Electrical activity was acquired at a sampling rate of 500Hz  and reviewed with a high frequency filter of 70Hz  and a low frequency filter of 1Hz . EEG data were recorded continuously and digitally stored.    Description: The posterior dominant rhythm consists of 7.5Hz  activity of moderate voltage (25-35 uV) seen predominantly in posterior head regions, symmetric and reactive to eye opening and eye closing. Sleep was characterized by vertex waves, sleep spindles (12 to 14 Hz), maximal frontocentral region.  EEG showed intermittent generalized and lateralized left hemisphere 3 to 5 Hz theta-delta slowing.   ABNORMALITY -Intermittent slow, generalized and lateralized left hemisphere   IMPRESSION: This study is suggestive of cortical dysfunction in left hemisphere as well as mild diffuse encephalopathy. No seizures or epileptiform discharges were seen during the study.  [name] 

## 2022-01-23 NOTE — Discharge Instructions (Addendum)
You were admitted to Epilepsy Monitoring Unit from 01/17/2022 to 01/23/2022. Your anti-seizure meds were held, hyperventilation, photic stimulation and sleep deprivation were performed. Two typical events were recorded. These events were seizures, likely arising from left fronto-temporal region vs generalized onset. Recommended increasing oxcarbazepine to 450mg  QHS, continue 300mg  QAM as you reported excessive drowsiness. Continue current doses of keppra, depakote and gabapentin. I discussed with Dr about potentially adding xcopri instead of gabapentin and/or oxcarb. Referral for pre-surgical workup was recommended. Referral was placed for Vagal Nerve stimulator as well. Seizure precautions were discussed.

## 2022-01-23 NOTE — Discharge Summary (Signed)
Physician Discharge Summary  Patient ID: Rhonda Schroeder MRN: [id number] DOB/AGE: 1991-05-07 31 y.o.  Admit date: 01/19/2022 Discharge date: 01/23/2022  Admission Diagnoses:You were admitted to Epilepsy Monitoring Unit from 01/17/2022 to 01/23/2022. Your anti-seizure meds were help, hyperventilation, photic stimulation and sleep deprivation were performed. Two typical events were recorded. These events were seizures, likely arising from left fronto-temporal region vs generalized onset. Recommended increasing oxcarbazepine to 450mg  QHS, continue 300mg  QAM as you reported excessive drowsiness. Continue current doses of keppra, depakote and gabapentin. I discussed with Dr Delice Lesch about potentially adding xcopri instead of gabapentin and/or oxcarb. Referral for pre-surgical workup was recommended. Referral was placed for Vagal Nerve stimulator as well. Seizure precautions were discussed.  Discharge Diagnoses: Epilepsy, not status epilepticus  Discharged Condition: stable  Hospital Course: Rhonda Schroeder was admitted to Epilepsy Monitoring Unit from 01/17/2022 to 01/23/2022. Her anti-seizure meds were held, hyperventilation, photic stimulation and sleep deprivation were performed. Two typical events were recorded. These events were seizures, likely arising from left fronto-temporal region vs generalized onset. We eecommended increasing oxcarbazepine to 450mg  QHS, continue 300mg  QAM as you reported excessive drowsiness. Continue current doses of keppra, depakote and gabapentin. I discussed with Dr Delice Lesch about potentially adding xcopri instead of gabapentin and/or oxcarb. Pre-surgical workup was recommended. Patient opted to proceed with VNS first. Referral was placed for Vagal Nerve stimulator as well. Seizure precautions were discussed.  Consults: None  Significant Diagnostic Studies:  Description: The posterior dominant rhythm consists of 7.5Hz  activity of moderate voltage (25-35 uV) seen predominantly in  posterior head regions, symmetric and reactive to eye opening and eye closing. Sleep was characterized by vertex waves, sleep spindles (12 to 14 Hz), maximal frontocentral region.  Physiologic photic driving was not seen during photic stimulation.  Generalized 5 to 7 Hz theta slowing was seen during hyperventilation.   Event button was pressed on 01/21/2022 at 0410 . During the event patient initially dint have any clinical signs. This was followed by forced right neck deviation. She then had whole body generalized tonic -clonic jerking. Concomitant EEG showed generalized 4.5-5hz  theta slowing It then evolved into 9-10hz  alpha activity followed by 15-16hz  beta activity. EEG then showed generalized 2-2.5hz  spike and wave with significant overlying myogenic artifact. Seizure lasted for about 2 minutes followed by generalized eeg attenuation.    Event button was pressed on 01/21/2022 at 0824 . During the event patient initially dint have any clinical signs. This was followed by behavioral arrest. Patient then had right gaze deviation ( didn't appear forced) followed by forced right neck deviation. She then had whole body stiffening and eventually generalized tonic -clonic jerking. Concomitant EEG showed left frontotemporal 4.5-5hz  theta slowing which involved right hemisphere within 1-2 seconds. It then evolved into generalized, maximal F3-F4 9-10hz  alpha activity followed by 15-16hz  beta activity. EEG then showed generalized 2-2.5hz  spike and wave with significant overlying myogenic artifact. Seizure lasted for about 2 minutes followed by generalized eeg attenuation.    ABNORMALITY - Seizure, left frontotemporal   IMPRESSION: This study showed two seizures as described above. He first seizure recorded on 01/21/2022 at 0410 appeared to have generalized onset. However, during the second seizure at 0824, there was left frontotemporal onset which spread to involve right hemisphere within 1-2 seconds. 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 However, generalized epilepsy or independent bilateral frontotemporal epilepsy cannot be completely excluded yet.  Treatments: Increase Oxcarb to 450mg  QHS, continue Oxcarb 300mg  QAM, continue other AEDs  Discharge Exam: Blood pressure (!) 102/45, pulse 76, temperature 98 F (36.7 C), temperature source Oral, resp. rate 18, SpO2 99 %.  General: lying in bed, NAD CVS: pulse-normal rate and rhythm RS: breathing comfortably Extremities: normal    Neuro: Rhonda: Alert, oriented, follows commands CN: pupils equal and reactive,  EOMI, face symmetric, tongue midline, normal sensation over face, Motor: 5/5 strength in all 4 extremities Coordination: normal Gait: not tested  Disposition: Discharge disposition: 01-Home or Self Care   Discharge Instructions     Diet - low sodium heart healthy   Complete by: As directed    Discharge instructions   Complete by: As directed      If patient has another seizure, call 911 and bring them back to the ED if: A.  The seizure lasts longer than 5 minutes.      B.  The patient doesn't wake shortly after the seizure or has new problems such as difficulty seeing, speaking or moving following the seizure C.  The patient was injured during the seizure D.  The patient has a temperature over 102 F (39C) E.  The patient vomited during the seizure and now is having trouble breathing    During the Seizure   - First, ensure adequate ventilation and place patients on the floor on their left side  Loosen clothing around the neck and ensure the airway is patent. If the patient is clenching the teeth, do not force the mouth open with any object as this can cause severe damage - Remove all items from the surrounding that can be hazardous. The patient may be oblivious to what's happening and may not even know what he or she is doing. If the patient is confused and wandering, either gently  guide him/her away and block access to outside areas - Reassure the individual and be comforting - Call 911. In most cases, the seizure ends before EMS arrives. However, there are cases when seizures may last over 3 to 5 minutes. Or the individual may have developed breathing difficulties or severe injuries. If a pregnant patient or a person with diabetes develops a seizure, it is prudent to call an ambulance. - Finally, if the patient does not regain full consciousness, then call EMS. Most patients will remain confused for about 45 to 90 minutes after a seizure, so you must use judgment in calling for help. - Avoid restraints but make sure the patient is in a bed with padded side rails - Place the individual in a lateral position with the neck slightly flexed; this will help the saliva drain from the mouth and prevent the tongue from falling backward - Remove all nearby furniture and other hazards from the area - Provide verbal assurance as the individual is regaining consciousness - Provide the patient with privacy if possible - Call for help and start treatment as ordered by the caregiver    After the Seizure (Postictal Stage)   After a seizure, most patients experience confusion, fatigue, muscle pain and/or a headache. Thus, one should permit the individual to sleep. For the next few days, reassurance is essential. Being calm and helping reorient the person is also of importance.   Most seizures are painless and end spontaneously. Seizures are not harmful to others but can lead to complications such as stress on the lungs, brain and the heart. Individuals with prior lung problems may develop labored breathing and respiratory distress.        Driving Restrictions   Complete by:  As directed    Per [facility] statutes, patients with seizures are not allowed to drive until they have been seizure-free for six months and cleared by a physician      Other Restrictions   Complete by: As  directed    Seizure precautions  Use caution when using heavy equipment or power tools. Avoid working on ladders or at heights. Take showers instead of baths. Ensure the water temperature is not too high on the home water heater. Do not go swimming alone. Do not lock yourself in a room alone (i.e. bathroom). When caring for infants or [place] children, sit down when holding, feeding, or changing them to minimize risk of injury to the child in the event you have a seizure. Maintain good sleep hygiene. Avoid alcohol.      Allergies as of 01/23/2022   No Known Allergies      Medication List     TAKE these medications    acetaminophen 500 MG tablet Commonly known as: TYLENOL Take 1,000 mg by mouth daily as needed for mild pain.   ARIPiprazole 5 MG tablet Commonly known as: ABILIFY Take 1 tablet (5 mg total) by mouth daily. What changed: when to take this   atomoxetine 40 MG capsule Commonly known as: Strattera Take 1 capsule (40 mg total) by mouth daily.   Baqsimi One Pack 3 MG/DOSE Powd Generic drug: Glucagon Place 1 spray into both nostrils as needed for low blood sugar.   benztropine 0.5 MG tablet Commonly known as: COGENTIN Take 1 tablet (0.5 mg total) by mouth at bedtime.   bisacodyl 5 MG EC tablet Commonly known as: DULCOLAX Take 5 mg by mouth daily as needed for moderate constipation.   divalproex 500 MG 24 hr tablet Commonly known as: DEPAKOTE ER Take 1 tablet (500 mg total) by mouth in the morning and at bedtime. What changed:  how much to take when to take this   Emgality 120 MG/ML Sosy Generic drug: Galcanezumab-gnlm Inject 1 Dose into the skin every 30 (thirty) days.   gabapentin 300 MG capsule Commonly known as: NEURONTIN TAKE 1 CAPSULE BY MOUTH TWICE A DAY   HAIR/SKIN/NAILS PO Take 1 Dose by mouth daily. Hair, Skin and Nails gummy   levETIRAcetam 1000 MG tablet Commonly known as: KEPPRA Take 1 and 1/2 tablets twice a day What changed:  how much  to take how to take this when to take this additional instructions   meloxicam 15 MG tablet Commonly known as: MOBIC Take 15 mg by mouth daily.   nortriptyline 50 MG capsule Commonly known as: PAMELOR Take 2 capsules (100 mg total) by mouth at bedtime.   OXcarbazepine 150 MG tablet Commonly known as: TRILEPTAL Take 3 tablets (450 mg total) by mouth at bedtime. What changed:  medication strength how much to take when to take this   Oxcarbazepine 300 MG tablet Commonly known as: TRILEPTAL Take 1 tablet (300 mg total) by mouth daily. Start taking on: January 24, 2022 What changed: You were already taking a medication with the same name, and this prescription was added. Make sure you understand how and when to take each.   Valtoco 20 MG Dose 2 x 10 MG/0.1ML Lqpk Generic drug: diazePAM (20 MG Dose) Spray in each nostril as instructed for seizure rescue. May give second dose after 4 hours if needed.       I have spent a total of  40  minutes with the patient reviewing hospital notes,  test results, labs and examining the patient as well as establishing an assessment and plan that was discussed personally with the patient.  > 50% of time was spent in direct patient care.   Signed: Charlsie Quest 01/23/2022, 10:16 AM

## 2022-01-23 NOTE — Progress Notes (Signed)
Pt wheeled of unit by a staff member. All belongings with pt.

## 2022-01-26 ENCOUNTER — Encounter: Payer: Self-pay | Admitting: Neurology

## 2022-01-26 MED ORDER — CYCLOBENZAPRINE HCL 10 MG PO TABS: 10.0000 mg | ORAL_TABLET | Freq: Three times a day (TID) | ORAL | 3 refills | Status: DC | PRN

## 2022-01-26 MED ORDER — IBUPROFEN 800 MG PO TABS
800.0000 mg | ORAL_TABLET | Freq: Three times a day (TID) | ORAL | 0 refills | Status: DC | PRN
Start: 2022-01-26 — End: 2022-03-06

## 2022-01-27 ENCOUNTER — Encounter: Payer: Self-pay | Admitting: Neurology

## 2022-01-28 ENCOUNTER — Encounter: Payer: Self-pay | Admitting: Neurology

## 2022-01-30 ENCOUNTER — Encounter: Payer: Self-pay | Admitting: Neurology

## 2022-01-30 ENCOUNTER — Ambulatory Visit: Payer: Medicaid Other | Admitting: Neurology

## 2022-01-30 VITALS — BP 109/75 | HR 104 | Ht 69.0 in | Wt 240.4 lb

## 2022-01-30 DIAGNOSIS — G40009 Localization-related (focal) (partial) idiopathic epilepsy and epileptic syndromes with seizures of localized onset, not intractable, without status epilepticus: Secondary | ICD-10-CM

## 2022-01-30 DIAGNOSIS — G43009 Migraine without aura, not intractable, without status migrainosus: Secondary | ICD-10-CM | POA: Diagnosis not present

## 2022-01-30 MED ORDER — TIZANIDINE HCL 4 MG PO TABS: 4.0000 mg | ORAL_TABLET | Freq: Three times a day (TID) | ORAL | 5 refills | Status: DC | PRN

## 2022-01-30 MED ORDER — VALTOCO 20 MG DOSE 10 MG/0.1ML NA LQPK
NASAL | 5 refills | Status: DC
Start: 2022-01-30 — End: 2022-09-23
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
## 2022-01-30 NOTE — Progress Notes (Unsigned)
Medication Samples have been provided to the patient.  Drug name: oxtellar xr       Strength: 600mg          Qty: 15tab  LOT  Exp.Date: 05/05/2022  Dosing instructions: take as prescribed   The patient has been instructed regarding the correct time, dose, and frequency of taking this medication, including desired effects and most common side effects.

## 2022-01-30 NOTE — Progress Notes (Unsigned)
Pt stated she had a seizure last night

## 2022-01-31 ENCOUNTER — Encounter: Payer: Self-pay | Admitting: Neurology

## 2022-02-02 ENCOUNTER — Encounter: Payer: Self-pay | Admitting: Neurology

## 2022-02-05 ENCOUNTER — Other Ambulatory Visit (HOSPITAL_COMMUNITY): Payer: Self-pay | Admitting: Psychiatry

## 2022-02-05 DIAGNOSIS — F4310 Post-traumatic stress disorder, unspecified: Secondary | ICD-10-CM

## 2022-02-08 ENCOUNTER — Other Ambulatory Visit: Payer: Self-pay | Admitting: Neurology

## 2022-02-08 ENCOUNTER — Encounter: Payer: Self-pay | Admitting: Neurology

## 2022-02-08 DIAGNOSIS — F4310 Post-traumatic stress disorder, unspecified: Secondary | ICD-10-CM

## 2022-02-08 DIAGNOSIS — F319 Bipolar disorder, unspecified: Secondary | ICD-10-CM

## 2022-02-09 ENCOUNTER — Other Ambulatory Visit: Payer: Self-pay

## 2022-02-09 DIAGNOSIS — F4310 Post-traumatic stress disorder, unspecified: Secondary | ICD-10-CM

## 2022-02-09 DIAGNOSIS — F319 Bipolar disorder, unspecified: Secondary | ICD-10-CM

## 2022-02-09 MED ORDER — GABAPENTIN 300 MG PO CAPS: 300.0000 mg | ORAL_CAPSULE | Freq: Two times a day (BID) | ORAL | 0 refills | Status: DC

## 2022-02-09 MED ORDER — OXTELLAR XR 600 MG PO TB24: ORAL_TABLET | ORAL | 6 refills | Status: DC

## 2022-02-10 ENCOUNTER — Other Ambulatory Visit: Payer: Self-pay | Admitting: Neurology

## 2022-02-19 ENCOUNTER — Telehealth (HOSPITAL_BASED_OUTPATIENT_CLINIC_OR_DEPARTMENT_OTHER): Payer: Medicaid Other | Admitting: Psychiatry

## 2022-02-19 ENCOUNTER — Encounter (HOSPITAL_COMMUNITY): Payer: Self-pay | Admitting: Psychiatry

## 2022-02-19 DIAGNOSIS — F319 Bipolar disorder, unspecified: Secondary | ICD-10-CM

## 2022-02-19 DIAGNOSIS — F4310 Post-traumatic stress disorder, unspecified: Secondary | ICD-10-CM

## 2022-02-19 MED ORDER — BENZTROPINE MESYLATE 0.5 MG PO TABS: 0.5000 mg | ORAL_TABLET | Freq: Every day | ORAL | 2 refills | Status: DC

## 2022-02-19 MED ORDER — ARIPIPRAZOLE 5 MG PO TABS: 5.0000 mg | ORAL_TABLET | Freq: Every day | ORAL | 2 refills | Status: DC

## 2022-02-19 MED ORDER — NORTRIPTYLINE HCL 50 MG PO CAPS: 100.0000 mg | ORAL_CAPSULE | Freq: Every day | ORAL | 2 refills | Status: DC
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
## 2022-02-25 ENCOUNTER — Encounter: Payer: Self-pay | Admitting: Neurology

## 2022-03-06 ENCOUNTER — Other Ambulatory Visit: Payer: Self-pay | Admitting: Neurology

## 2022-03-06 DIAGNOSIS — F319 Bipolar disorder, unspecified: Secondary | ICD-10-CM

## 2022-03-06 DIAGNOSIS — F4310 Post-traumatic stress disorder, unspecified: Secondary | ICD-10-CM

## 2022-03-16 ENCOUNTER — Other Ambulatory Visit: Payer: Self-pay

## 2022-03-16 DIAGNOSIS — R400 Somnolence: Secondary | ICD-10-CM

## 2022-03-25 ENCOUNTER — Other Ambulatory Visit: Payer: Self-pay | Admitting: Neurology

## 2022-04-07 ENCOUNTER — Encounter: Payer: Self-pay | Admitting: Neurology

## 2022-04-12 ENCOUNTER — Encounter (HOSPITAL_BASED_OUTPATIENT_CLINIC_OR_DEPARTMENT_OTHER): Payer: Self-pay | Admitting: Internal Medicine

## 2022-04-13 ENCOUNTER — Ambulatory Visit (HOSPITAL_BASED_OUTPATIENT_CLINIC_OR_DEPARTMENT_OTHER): Payer: Medicaid Other | Attending: Neurology | Admitting: Internal Medicine

## 2022-04-13 VITALS — Ht 68.0 in

## 2022-04-13 DIAGNOSIS — R400 Somnolence: Secondary | ICD-10-CM | POA: Insufficient documentation

## 2022-04-13 DIAGNOSIS — G4700 Insomnia, unspecified: Secondary | ICD-10-CM | POA: Diagnosis not present

## 2022-04-13 DIAGNOSIS — R569 Unspecified convulsions: Secondary | ICD-10-CM | POA: Insufficient documentation

## 2022-04-19 DIAGNOSIS — R400 Somnolence: Secondary | ICD-10-CM | POA: Diagnosis not present

## 2022-04-19 NOTE — Procedures (Signed)
       Patient Name: Rhonda Schroeder, [name] Date: 04/13/2022 Gender: Female D.O.B: Sep 07, 1990 Age (years): 31 Referring Provider: Cameron Sprang Height (inches): 64 Interpreting Physician: Baird Lyons MD, ABSM Weight (lbs): 270 RPSGT: Gwenyth Allegra BMI: 41 MRN: [id number] Neck Size: 14.00  CLINICAL INFORMATION Sleep Study Type: NPSG Indication for sleep study: Insomnia with OSA Epworth Sleepiness Score: 16  SLEEP STUDY TECHNIQUE As per the AASM Manual for the Scoring of Sleep and Associated Events v2.3 (April 2016) with a hypopnea requiring 4% desaturations.  The channels recorded and monitored were frontal, central and occipital EEG, electrooculogram (EOG), submentalis EMG (chin), nasal and oral airflow, thoracic and abdominal wall motion, anterior tibialis EMG, snore microphone, electrocardiogram, and pulse oximetry.  MEDICATIONS Medications self-administered by patient taken the night of the study : Melatonin  SLEEP ARCHITECTURE The study was initiated at 10:54:32 PM and ended at 5:01:24 AM.  Sleep onset time was 7.7 minutes and the sleep efficiency was 91.6%%. The total sleep time was 336 minutes.  Stage REM latency was 76.5 minutes.  The patient spent 2.5%% of the night in stage N1 sleep, 75.1%% in stage N2 sleep, 0.0%% in stage N3 and 22.3% in REM.  Alpha intrusion was absent.  Supine sleep was 46.89%.  RESPIRATORY PARAMETERS The overall apnea/hypopnea index (AHI) was 2.1 per hour. There were 0 total apneas, including 0 obstructive, 0 central and 0 mixed apneas. There were 12 hypopneas and 0 RERAs.  The AHI during Stage REM sleep was 8.8 per hour.  AHI while supine was 2.7 per hour.  The mean oxygen saturation was 95.2%. The minimum SpO2 during sleep was 91.0%.  snoring was noted during this study.  CARDIAC DATA The 2 lead EKG demonstrated sinus rhythm. The mean heart rate was 92.3 beats per minute. Other EKG findings include: None.  LEG MOVEMENT  DATA The total PLMS were 0 with a resulting PLMS index of 0.0. Associated arousal with leg movement index was 0.9 .  IMPRESSIONS - No significant obstructive sleep apnea occurred during this study (AHI = 2.1/h). Events limited to a few hypopneas in REM. - The patient had minimal or no oxygen desaturation during the study (Min O2 = 91.0%) - No snoring was audible during this study. - No cardiac abnormalities were noted during this study. - Clinically significant periodic limb movements did not occur during sleep. No significant associated arousals. - No seizure activity noted.  DIAGNOSIS - Normal study  RECOMMENDATIONS - Manage for symptoms based on clinical judgment. - Sleep hygiene should be reviewed to assess factors that may improve sleep quality. - Weight management and regular exercise should be initiated or continued if appropriate.  [Electronically signed] 04/19/2022 11:53 AM  Baird Lyons MD, ABSM Diplomate, American Board of Sleep Medicine NPI: [id number]                      [place] [place] [facility]  ELECTRONICALLY SIGNED ON:  04/19/2022, 11:48 AM [place] PH: [phone]   FX: [phone] [place]

## 2022-04-20 ENCOUNTER — Telehealth: Payer: Self-pay

## 2022-04-20 NOTE — Telephone Encounter (Signed)
-----   Message from Cameron Sprang, MD sent at 04/20/2022  9:25 AM EDT ----- Pls let her know the sleep study was normal, no sleep apnea or other significant abnormalities seen. Thanks  ----- Message ----- From: Deneise Lever, MD Sent: 04/19/2022  11:55 AM EDT To: Cameron Sprang, MD

## 2022-04-20 NOTE — Telephone Encounter (Signed)
Pt called an informed the sleep study was normal, no sleep apnea or other significant abnormalities seen

## 2022-04-21 ENCOUNTER — Encounter: Payer: Self-pay | Admitting: Neurology

## 2022-04-24 ENCOUNTER — Other Ambulatory Visit: Payer: Self-pay | Admitting: Neurosurgery

## 2022-04-28 ENCOUNTER — Other Ambulatory Visit: Payer: Self-pay | Admitting: Neurology

## 2022-04-28 ENCOUNTER — Other Ambulatory Visit: Payer: Self-pay | Admitting: Neurosurgery

## 2022-04-29 MED ORDER — OXTELLAR XR 600 MG PO TB24: ORAL_TABLET | ORAL | 3 refills | Status: DC

## 2022-04-29 MED ORDER — OXCARBAZEPINE ER 300 MG PO TB24: ORAL_TABLET | ORAL | 3 refills | Status: DC

## 2022-05-07 NOTE — Pre-Procedure Instructions (Signed)
Surgical Instructions    Your procedure is scheduled on May 15, 2022.  Report to [facility] Main Entrance "A" at 9:25 A.M., then check in with the Admitting office.  Call this number if you have problems the morning of surgery:  [phone]   If you have any questions prior to your surgery date call [phone]: Open Monday-Friday 8am-4pm    Remember:  Do not eat or drink after midnight the night before your surgery      Take these medicines the morning of surgery with A SIP OF WATER:  ARIPiprazole (ABILIFY)   divalproex (DEPAKOTE ER) levETIRAcetam (KEPPRA)     Take these medicines the morning of surgery with a sip of water AS NEEDED:  acetaminophen (TYLENOL)   BAQSIMI   diazePAM    As of today, STOP taking any Aspirin (unless otherwise instructed by your surgeon) Aleve, Naproxen, Ibuprofen, Motrin, Advil, Goody's, BC's, all herbal medications, fish oil, and all vitamins. This includes your medication: meloxicam (MOBIC)                      Do NOT Smoke (Tobacco/Vaping) for 24 hours prior to your procedure.  If you use a CPAP at night, you may bring your mask/headgear for your overnight stay.   Contacts, glasses, piercing's, hearing aid's, dentures or partials may not be worn into surgery, please bring cases for these belongings.    For patients admitted to the hospital, discharge time will be determined by your treatment team.   Patients discharged the day of surgery will not be allowed to drive home, and someone needs to stay with them for 24 hours.  SURGICAL WAITING ROOM VISITATION Patients having surgery or a procedure may have no more than 2 support people in the waiting area - these visitors may rotate.   Children under the age of 66 must have an adult with them who is not the patient. If the patient needs to stay at the hospital during part of their recovery, the visitor guidelines for inpatient rooms apply. Pre-op nurse will coordinate an appropriate time  for 1 support person to accompany patient in pre-op.  This support person may not rotate.   Please refer to the [facility] website for the visitor guidelines for Inpatients (after your surgery is over and you are in a regular room).    Special instructions:   [place]- Preparing For Surgery  Before surgery, you can play an important role. Because skin is not sterile, your skin needs to be as free of germs as possible. You can reduce the number of germs on your skin by washing with CHG (chlorahexidine gluconate) Soap before surgery.  CHG is an antiseptic cleaner which kills germs and bonds with the skin to continue killing germs even after washing.    Oral Hygiene is also important to reduce your risk of infection.  Remember - BRUSH YOUR TEETH THE MORNING OF SURGERY WITH YOUR REGULAR TOOTHPASTE  Please do not use if you have an allergy to CHG or antibacterial soaps. If your skin becomes reddened/irritated stop using the CHG.  Do not shave (including legs and underarms) for at least 48 hours prior to first CHG shower. It is OK to shave your face.  Please follow these instructions carefully.   Shower the NIGHT BEFORE SURGERY and the MORNING OF SURGERY  If you chose to wash your hair, wash your hair first as usual with your normal shampoo.  After you shampoo, rinse your hair and  body thoroughly to remove the shampoo.  Use CHG Soap as you would any other liquid soap. You can apply CHG directly to the skin and wash gently with a scrungie or a clean washcloth.   Apply the CHG Soap to your body ONLY FROM THE NECK DOWN.  Do not use on open wounds or open sores. Avoid contact with your eyes, ears, mouth and genitals (private parts). Wash Face and genitals (private parts)  with your normal soap.   Wash thoroughly, paying special attention to the area where your surgery will be performed.  Thoroughly rinse your body with warm water from the neck down.  DO NOT shower/wash with your normal soap  after using and rinsing off the CHG Soap.  Pat yourself dry with a CLEAN TOWEL.  Wear CLEAN PAJAMAS to bed the night before surgery  Place CLEAN SHEETS on your bed the night before your surgery  DO NOT SLEEP WITH PETS.   Day of Surgery: Take a shower with CHG soap. Do not wear jewelry or makeup Do not wear lotions, powders, perfumes/colognes, or deodorant. Do not shave 48 hours prior to surgery.  Men may shave face and neck. Do not bring valuables to the hospital.  [facility] is not responsible for any belongings or valuables. Do not wear nail polish, gel polish, artificial nails, or any other type of covering on natural nails (fingers and toes) If you have artificial nails or gel coating that need to be removed by a nail salon, please have this removed prior to surgery. Artificial nails or gel coating may interfere with anesthesia's ability to adequately monitor your vital signs.  Wear Clean/Comfortable clothing the morning of surgery Remember to brush your teeth WITH YOUR REGULAR TOOTHPASTE.   Please read over the following fact sheets that you were given.    If you received a COVID test during your pre-op visit  it is requested that you wear a mask when out in public, stay away from anyone that may not be feeling well and notify your surgeon if you develop symptoms. If you have been in contact with anyone that has tested positive in the last 10 days please notify you surgeon.

## 2022-05-08 ENCOUNTER — Other Ambulatory Visit: Payer: Self-pay

## 2022-05-08 ENCOUNTER — Encounter (HOSPITAL_COMMUNITY)
Admission: RE | Admit: 2022-05-08 | Discharge: 2022-05-08 | Disposition: A | Discharge status: Home / Self Care | Payer: Medicaid Other | Source: Ambulatory Visit | Attending: Neurosurgery | Admitting: Neurosurgery

## 2022-05-08 ENCOUNTER — Encounter (HOSPITAL_COMMUNITY): Payer: Self-pay

## 2022-05-08 VITALS — BP 129/85 | HR 99 | Temp 98.3°F | Resp 17 | Ht 68.0 in | Wt 286.0 lb

## 2022-05-08 DIAGNOSIS — Z01812 Encounter for preprocedural laboratory examination: Secondary | ICD-10-CM | POA: Insufficient documentation

## 2022-05-08 DIAGNOSIS — G988 Other disorders of nervous system: Secondary | ICD-10-CM | POA: Diagnosis not present

## 2022-05-08 DIAGNOSIS — Z01818 Encounter for other preprocedural examination: Secondary | ICD-10-CM

## 2022-05-08 HISTORY — DX: Bipolar disorder, unspecified: F31.9

## 2022-05-08 HISTORY — DX: Anemia, unspecified: D64.9

## 2022-05-08 HISTORY — DX: Attention-deficit hyperactivity disorder, unspecified type: F90.9

## 2022-05-08 LAB — BASIC METABOLIC PANEL
Anion gap: 9 (ref 5–15)
BUN: 13 mg/dL (ref 6–20)
CO2: 27 mmol/L (ref 22–32)
Calcium: 9.1 mg/dL (ref 8.9–10.3)
Chloride: 100 mmol/L (ref 98–111)
Creatinine, Ser: 0.81 mg/dL (ref 0.44–1.00)
GFR, Estimated: >60 mL/min (ref >60)
Glucose, Bld: 97 mg/dL (ref 70–99)
Potassium: 4.4 mmol/L (ref 3.5–5.1)
Sodium: 136 mmol/L (ref 135–145)

## 2022-05-08 LAB — CBC
HCT: 39.2 % (ref 36.0–46.0)
Hemoglobin: 12.7 g/dL (ref 12.0–15.0)
MCH: 28.9 pg (ref 26.0–34.0)
MCHC: 32.4 g/dL (ref 30.0–36.0)
MCV: 89.1 fL (ref 80.0–100.0)
Platelets: 225 10*3/uL (ref 150–400)
RBC: 4.4 MIL/uL (ref 3.87–5.11)
RDW: 13.5 % (ref 11.5–15.5)
WBC: 7.1 10*3/uL (ref 4.0–10.5)
nRBC: 0 % (ref 0.0–0.2)

## 2022-05-08 NOTE — Progress Notes (Signed)
PCP - Casimiro Needle, NP Cardiologist - Denies  PPM/ICD - Denies Device Orders - n/a Rep Notified - n/a  Chest x-ray - n/a EKG - 01/19/2022 Stress Test - Denies ECHO - 08/19/2012  Cardiac Cath - Denies  Sleep Study - 04/14/2022 results were negative for sleep apnea CPAP - n/a  No DM  Last dose of GLP1 agonist- n/a GLP1 instructions: n/a  Blood Thinner Instructions: n/a Aspirin Instructions: n/a  NPO after midnight   COVID TEST- n/a   Anesthesia review: No.   Patient denies shortness of breath, fever, cough and chest pain at PAT appointment   All instructions explained to the patient, with a verbal understanding of the material. Patient agrees to go over the instructions while at home for a better understanding. Patient also instructed to self quarantine after being tested for COVID-19. The opportunity to ask questions was provided.

## 2022-05-12 ENCOUNTER — Other Ambulatory Visit (HOSPITAL_COMMUNITY): Payer: Self-pay | Admitting: *Deleted

## 2022-05-12 DIAGNOSIS — F319 Bipolar disorder, unspecified: Secondary | ICD-10-CM

## 2022-05-12 DIAGNOSIS — F4310 Post-traumatic stress disorder, unspecified: Secondary | ICD-10-CM

## 2022-05-12 MED ORDER — ARIPIPRAZOLE 5 MG PO TABS: 5.0000 mg | ORAL_TABLET | Freq: Every day | ORAL | 0 refills | Status: DC

## 2022-05-14 ENCOUNTER — Other Ambulatory Visit (HOSPITAL_COMMUNITY): Payer: Self-pay | Admitting: Psychiatry

## 2022-05-14 ENCOUNTER — Telehealth (HOSPITAL_COMMUNITY): Payer: Self-pay | Admitting: *Deleted

## 2022-05-14 DIAGNOSIS — F319 Bipolar disorder, unspecified: Secondary | ICD-10-CM

## 2022-05-14 IMAGING — MR MR HEAD WO/W CM
16 series · 48 of 48 positions shown · IV contrast (15 mL Multihance)
Comparison: MRI head 11/23/2018

CLINICAL DATA: Seizure

EXAM:
MRI HEAD WITHOUT AND WITH CONTRAST
TECHNIQUE: Multiplanar, multiecho pulse sequences of the brain and surrounding
structures were obtained without and with intravenous contrast.
CONTRAST:  15mL MULTIHANCE GADOBENATE DIMEGLUMINE 529 MG/ML IV SOLN

[Series 5: T1 · sagittal · 4.0mm · 0.75mm/px · 1 of 32 slices shown (1 of 4)]
[im 1/32]
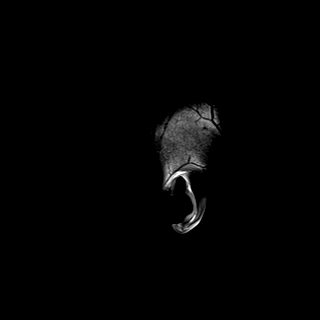

[Series 6: DWI · axial · 3.0mm · 0.94mm/px · z∈[-58,+90]mm · 7 of 168 slices shown (1 of 3)]
[im 1/168]
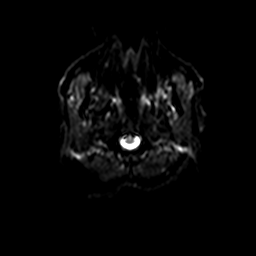
[im 28/168]
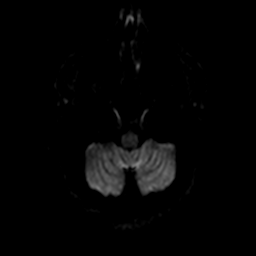
[im 56/168]
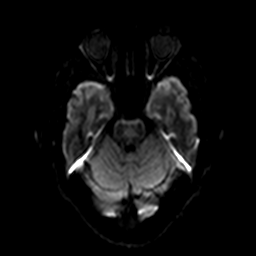
[im 84/168]
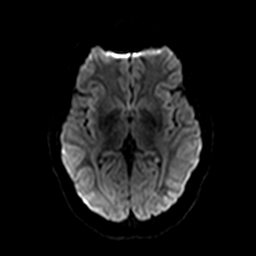
[im 112/168]
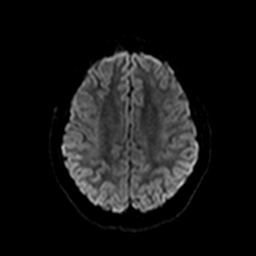
[im 140/168]
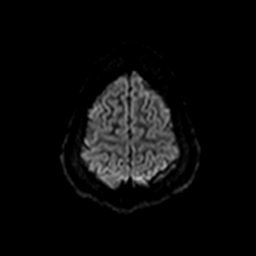
[im 168/168]
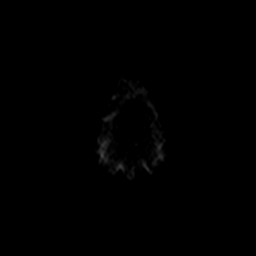

[Series 7: ax dwi_tracew · axial · 3.0mm · 0.94mm/px · z∈[-58,+90]mm · 4 of 84 slices shown]
[im 1/84]
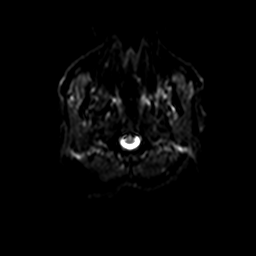
[im 28/84]
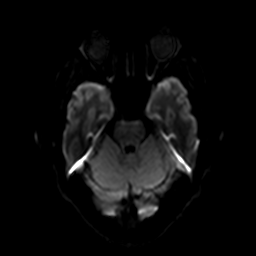
[im 56/84]
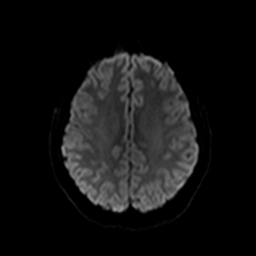
[im 84/84]
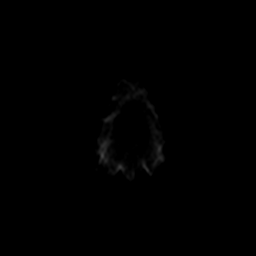

[Series 8: ax dwi_adc · axial · 3.0mm · 0.94mm/px · z∈[-58,+90]mm · 2 of 42 slices shown]
[im 1/42]
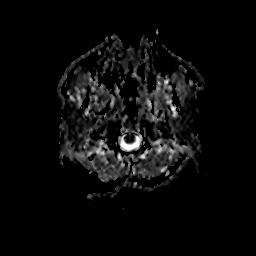
[im 42/42]
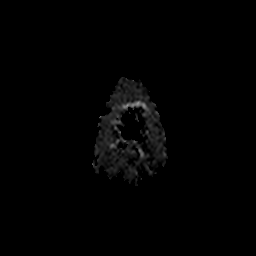

[Series 9: DWI · coronal · 5.0mm · 1.44mm/px · 3 of 70 slices shown (2 of 3)]
[im 1/70]
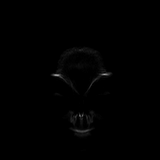
[im 35/70]
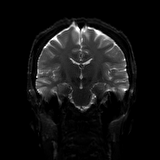
[im 70/70]
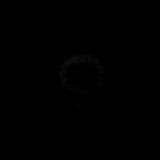

[Series 10: DWI · coronal · 5.0mm · 1.44mm/px · 2 of 35 slices shown (3 of 3)]
[im 1/35]
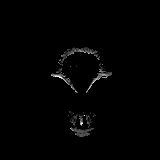
[im 35/35]
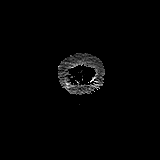

[Series 11: T2 · axial · 4.0mm · 0.36mm/px · 1 of 30 slices shown (1 of 2)]
[im 1/30]
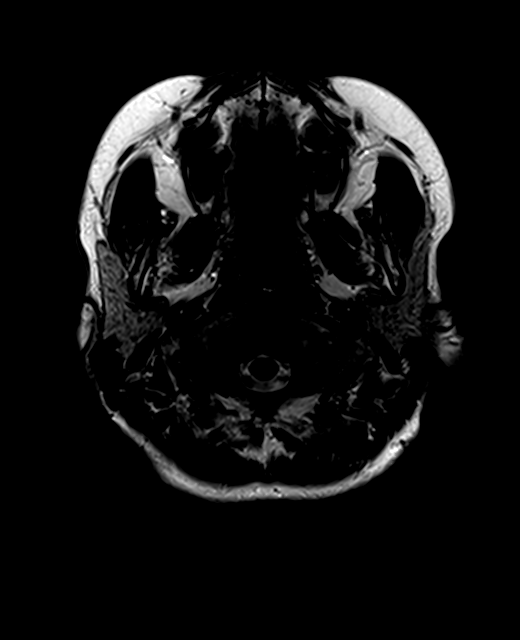

[Series 12: FLAIR · axial · 3.0mm · 0.72mm/px · 1 of 26 slices shown (1 of 2)]
[im 1/26]
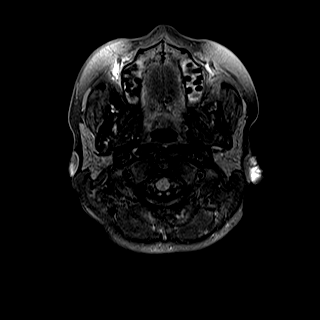

[Series 14: swi_images · axial · 3.0mm · 0.90mm/px · z∈[-61,+92]mm · 2 of 52 slices shown]
[im 1/52]
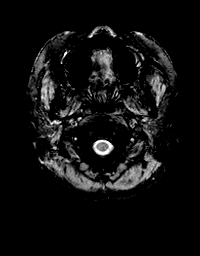
[im 52/52]
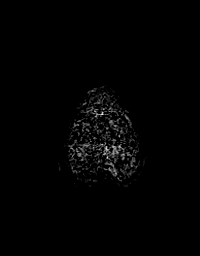

[Series 15: T1 · axial · 1.0mm · 0.90mm/px · z∈[-64,+95]mm · 7 of 160 slices shown (2 of 4)]
[im 1/160]
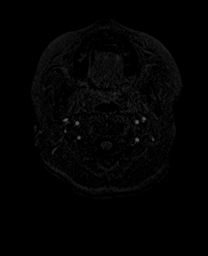
[im 27/160]
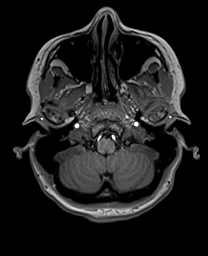
[im 54/160]
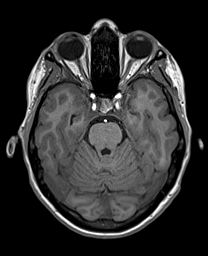
[im 80/160]
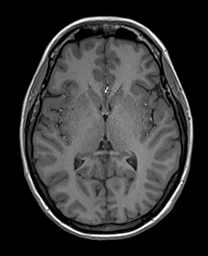
[im 107/160]
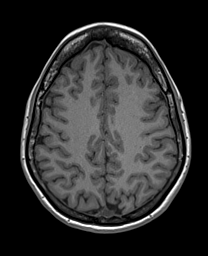
[im 133/160]
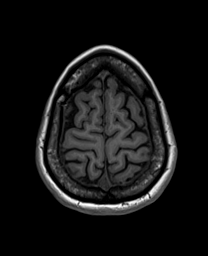
[im 160/160]
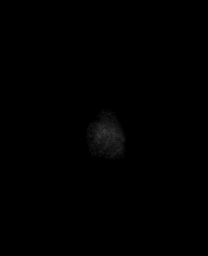

[Series 16: T2 · coronal · 3.0mm · 0.47mm/px · 1 of 27 slices shown (2 of 2)]
[im 1/27]
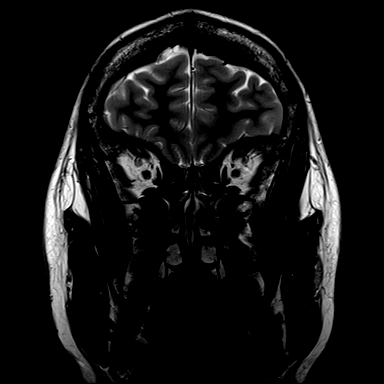

[Series 17: FLAIR · coronal · 3.0mm · 0.56mm/px · 1 of 27 slices shown (2 of 2)]
[im 1/27]
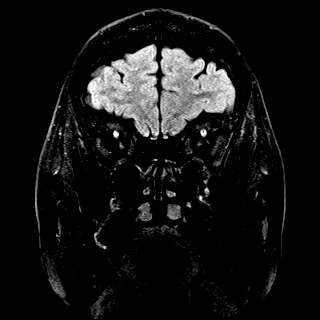

[Series 18: T2 post-contrast · coronal · 4.0mm · 0.36mm/px · 2 of 37 slices shown]
[im 1/37]
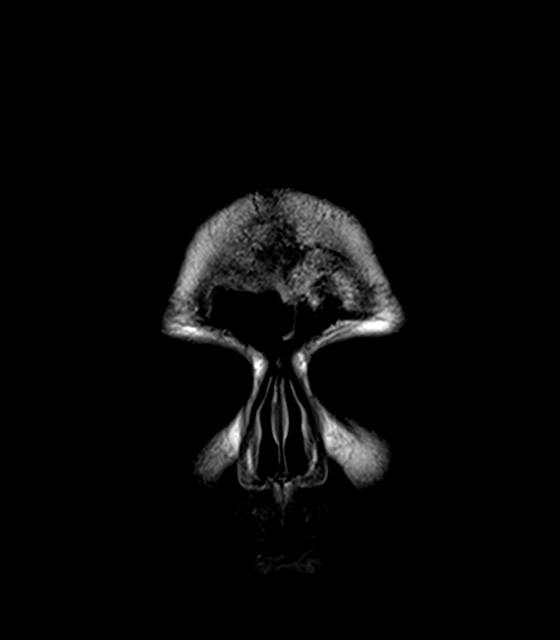
[im 37/37]
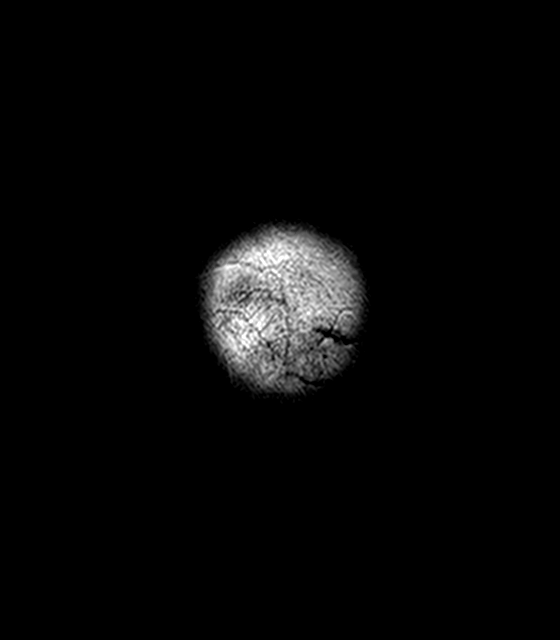

[Series 19: T1 · axial · 1.0mm · 0.90mm/px · z∈[-64,+95]mm · 7 of 160 slices shown (3 of 4)]
[im 1/160]
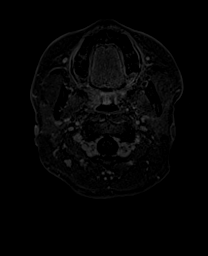
[im 27/160]
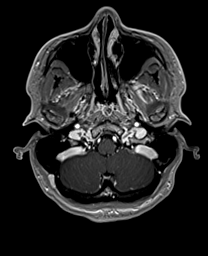
[im 54/160]
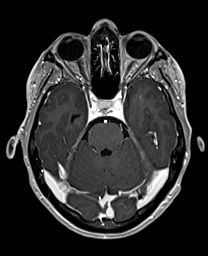
[im 80/160]
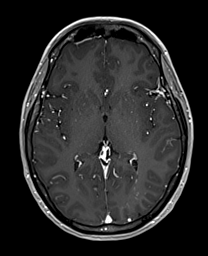
[im 107/160]
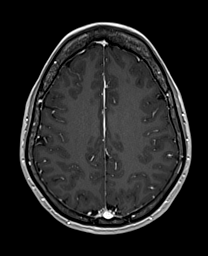
[im 133/160]
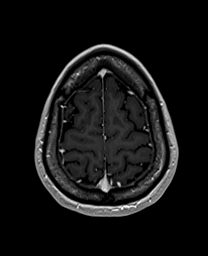
[im 160/160]
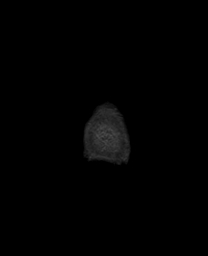

[Series 20: T1 post-contrast · coronal · 4.0mm · 0.72mm/px · 2 of 37 slices shown]
[im 1/37]
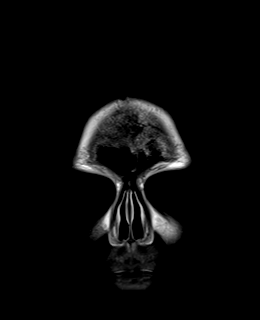
[im 37/37]
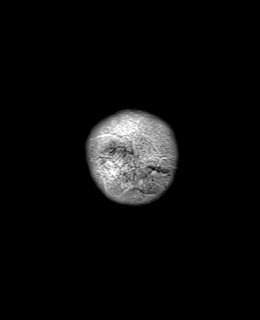

[Series 100: T1 · coronal · 3.0mm · 0.90mm/px · 5 of 108 slices shown (4 of 4)]
[im 1/108]
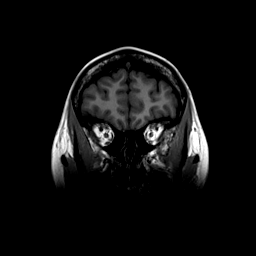
[im 27/108]
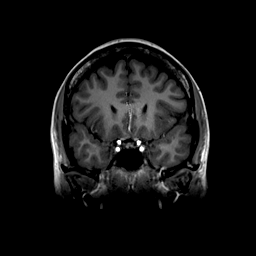
[im 54/108]
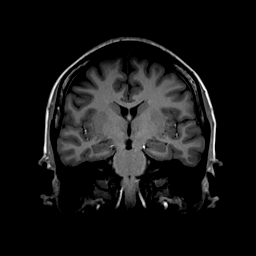
[im 81/108]
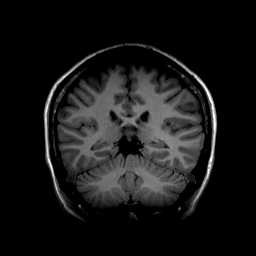
[im 108/108]
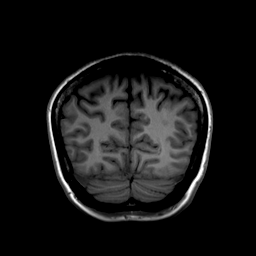

[48 of 48 positions shown; findings below may reference images not displayed]

FINDINGS: Brain: No acute infarction, hemorrhage, hydrocephalus, extra-axial
collection or mass lesion.

Vascular: Normal arterial flow voids

Vascular enhancement in the left occipital lobe compatible with
developmental venous anomaly, unchanged from the prior MRI. No
surrounding signal abnormality.

Skull and upper cervical spine: Negative

Sinuses/Orbits: Negative

Other: None
IMPRESSION: Developmental venous anomaly left occipital lobe unchanged from
prior MRI. Otherwise normal MRI of the brain. No acute abnormality.
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
## 2022-05-14 NOTE — Anesthesia Preprocedure Evaluation (Addendum)
Anesthesia Evaluation  Patient identified by MRN, date of birth, ID band Patient awake    Reviewed: Allergy & Precautions, NPO status , Patient's Chart, lab work & pertinent test results  History of Anesthesia Complications Negative for: history of anesthetic complications  Airway Mallampati: II  TM Distance: >3 FB Neck ROM: Full    Dental  (+) Dental Advisory Given, Teeth Intact   Pulmonary neg pulmonary ROS   Pulmonary exam normal        Cardiovascular negative cardio ROS Normal cardiovascular exam     Neuro/Psych  Headaches, Seizures -, Poorly Controlled,  PSYCHIATRIC DISORDERS Anxiety Depression Bipolar Disorder      GI/Hepatic negative GI ROS,,,(+)     substance abuse  alcohol use  Endo/Other    Morbid obesity  Renal/GU negative Renal ROS     Musculoskeletal  (+) Arthritis ,    Abdominal   Peds  (+) ADHD Hematology  (+) Blood dyscrasia, anemia   Anesthesia Other Findings   Reproductive/Obstetrics                             Anesthesia Physical Anesthesia Plan  ASA: 3  Anesthesia Plan: General   Post-op Pain Management: Tylenol PO (pre-op)* and Celebrex PO (pre-op)*   Induction: Intravenous  PONV Risk Score and Plan: 3 and Treatment may vary due to age or medical condition, Ondansetron, Dexamethasone and Midazolam  Airway Management Planned: Oral ETT  Additional Equipment: None  Intra-op Plan:   Post-operative Plan: Extubation in OR  Informed Consent: I have reviewed the patients History and Physical, chart, labs and discussed the procedure including the risks, benefits and alternatives for the proposed anesthesia with the patient or authorized representative who has indicated his/her understanding and acceptance.     Dental advisory given  Plan Discussed with: CRNA and Anesthesiologist  Anesthesia Plan Comments: (See PAT note)       Anesthesia Quick  Evaluation

## 2022-05-14 NOTE — Telephone Encounter (Signed)
PA FOR ABILIFY 5 MG TABS SUBMITTED TO COVER MY MEDS FOR APPROVAL.  AWAITING DETERMINATION.  KEY: [id number]

## 2022-05-15 ENCOUNTER — Other Ambulatory Visit: Payer: Self-pay

## 2022-05-15 ENCOUNTER — Ambulatory Visit (HOSPITAL_COMMUNITY): Payer: Medicaid Other | Admitting: Vascular Surgery

## 2022-05-15 ENCOUNTER — Ambulatory Visit (HOSPITAL_COMMUNITY)
Admission: RE | Admit: 2022-05-15 | Discharge: 2022-05-15 | Disposition: A | Discharge status: Home / Self Care | Payer: Medicaid Other | Attending: Neurosurgery | Admitting: Neurosurgery

## 2022-05-15 ENCOUNTER — Encounter (HOSPITAL_COMMUNITY)
Admission: RE | Disposition: A | Discharge status: Home / Self Care | Payer: Self-pay | Source: Home / Self Care | Attending: Neurosurgery

## 2022-05-15 ENCOUNTER — Encounter (HOSPITAL_COMMUNITY): Payer: Self-pay | Admitting: Neurosurgery

## 2022-05-15 ENCOUNTER — Ambulatory Visit (HOSPITAL_BASED_OUTPATIENT_CLINIC_OR_DEPARTMENT_OTHER): Payer: Medicaid Other | Admitting: Vascular Surgery

## 2022-05-15 DIAGNOSIS — F418 Other specified anxiety disorders: Secondary | ICD-10-CM

## 2022-05-15 DIAGNOSIS — G40419 Other generalized epilepsy and epileptic syndromes, intractable, without status epilepticus: Secondary | ICD-10-CM | POA: Diagnosis not present

## 2022-05-15 DIAGNOSIS — F909 Attention-deficit hyperactivity disorder, unspecified type: Secondary | ICD-10-CM | POA: Diagnosis not present

## 2022-05-15 DIAGNOSIS — Z79899 Other long term (current) drug therapy: Secondary | ICD-10-CM | POA: Diagnosis not present

## 2022-05-15 DIAGNOSIS — M1990 Unspecified osteoarthritis, unspecified site: Secondary | ICD-10-CM | POA: Diagnosis not present

## 2022-05-15 DIAGNOSIS — F319 Bipolar disorder, unspecified: Secondary | ICD-10-CM

## 2022-05-15 DIAGNOSIS — Z01818 Encounter for other preprocedural examination: Secondary | ICD-10-CM

## 2022-05-15 DIAGNOSIS — Z6841 Body Mass Index (BMI) 40.0 and over, adult: Secondary | ICD-10-CM | POA: Insufficient documentation

## 2022-05-15 HISTORY — PX: VAGUS NERVE STIMULATOR INSERTION: SHX348

## 2022-05-15 LAB — POCT PREGNANCY, URINE: Preg Test, Ur: NEGATIVE

## 2022-05-15 SURGERY — VAGAL NERVE STIMULATOR IMPLANT
Anesthesia: General | Laterality: Left

## 2022-05-15 MED ORDER — FENTANYL CITRATE (PF) 250 MCG/5ML IJ SOLN
INTRAMUSCULAR | Status: DC | PRN
  Administered 2022-05-15 (×5): 50 ug via INTRAVENOUS

## 2022-05-15 MED ORDER — LIDOCAINE-EPINEPHRINE 1 %-1:100000 IJ SOLN
INTRAMUSCULAR | Status: DC | PRN
  Administered 2022-05-15: 10 mL

## 2022-05-15 MED ORDER — BUPIVACAINE HCL (PF) 0.5 % IJ SOLN
INTRAMUSCULAR | Status: AC
  Filled 2022-05-15: qty 30

## 2022-05-15 MED ORDER — PROPOFOL 10 MG/ML IV BOLUS
INTRAVENOUS | Status: DC | PRN
  Administered 2022-05-15: 200 mg via INTRAVENOUS

## 2022-05-15 MED ORDER — CELECOXIB 200 MG PO CAPS
200.0000 mg | ORAL_CAPSULE | Freq: Once | ORAL | Status: AC
  Administered 2022-05-15: 200 mg via ORAL
  Filled 2022-05-15: qty 1

## 2022-05-15 MED ORDER — ONDANSETRON HCL 4 MG/2ML IJ SOLN: 4.0000 mg | Freq: Once | INTRAMUSCULAR | Status: DC | PRN

## 2022-05-15 MED ORDER — FENTANYL CITRATE (PF) 100 MCG/2ML IJ SOLN
INTRAMUSCULAR | Status: AC
  Filled 2022-05-15: qty 2

## 2022-05-15 MED ORDER — OXYCODONE HCL 5 MG PO TABS
5.0000 mg | ORAL_TABLET | Freq: Once | ORAL | Status: AC | PRN
  Administered 2022-05-15: 5 mg via ORAL

## 2022-05-15 MED ORDER — ROCURONIUM BROMIDE 10 MG/ML (PF) SYRINGE
PREFILLED_SYRINGE | INTRAVENOUS | Status: DC | PRN
  Administered 2022-05-15: 50 mg via INTRAVENOUS
  Administered 2022-05-15: 20 mg via INTRAVENOUS

## 2022-05-15 MED ORDER — ONDANSETRON HCL 4 MG/2ML IJ SOLN
INTRAMUSCULAR | Status: DC | PRN
  Administered 2022-05-15: 4 mg via INTRAVENOUS

## 2022-05-15 MED ORDER — ORAL CARE MOUTH RINSE: 15.0000 mL | Freq: Once | OROMUCOSAL | Status: AC

## 2022-05-15 MED ORDER — ONDANSETRON HCL 4 MG/2ML IJ SOLN
INTRAMUSCULAR | Status: AC
  Filled 2022-05-15: qty 2

## 2022-05-15 MED ORDER — OXYCODONE HCL 5 MG PO TABS
ORAL_TABLET | ORAL | Status: AC
  Filled 2022-05-15: qty 1

## 2022-05-15 MED ORDER — OXYCODONE HCL 5 MG/5ML PO SOLN: 5.0000 mg | Freq: Once | ORAL | Status: AC | PRN

## 2022-05-15 MED ORDER — CHLORHEXIDINE GLUCONATE 0.12 % MT SOLN
15.0000 mL | Freq: Once | OROMUCOSAL | Status: AC
  Administered 2022-05-15: 15 mL via OROMUCOSAL
  Filled 2022-05-15: qty 15

## 2022-05-15 MED ORDER — LACTATED RINGERS IV SOLN: INTRAVENOUS | Status: DC

## 2022-05-15 MED ORDER — LIDOCAINE-EPINEPHRINE 1 %-1:100000 IJ SOLN
INTRAMUSCULAR | Status: AC
  Filled 2022-05-15: qty 1

## 2022-05-15 MED ORDER — ROCURONIUM BROMIDE 10 MG/ML (PF) SYRINGE
PREFILLED_SYRINGE | INTRAVENOUS | Status: AC
  Filled 2022-05-15: qty 20

## 2022-05-15 MED ORDER — DEXAMETHASONE SODIUM PHOSPHATE 10 MG/ML IJ SOLN
INTRAMUSCULAR | Status: AC
  Filled 2022-05-15: qty 1

## 2022-05-15 MED ORDER — CEFAZOLIN IN SODIUM CHLORIDE 3-0.9 GM/100ML-% IV SOLN
3.0000 g | INTRAVENOUS | Status: AC
  Administered 2022-05-15: 3 g via INTRAVENOUS
  Filled 2022-05-15: qty 100

## 2022-05-15 MED ORDER — ACETAMINOPHEN 500 MG PO TABS
1000.0000 mg | ORAL_TABLET | Freq: Once | ORAL | Status: AC
  Administered 2022-05-15: 1000 mg via ORAL
  Filled 2022-05-15: qty 2

## 2022-05-15 MED ORDER — LIDOCAINE 2% (20 MG/ML) 5 ML SYRINGE
INTRAMUSCULAR | Status: DC | PRN
  Administered 2022-05-15: 60 mg via INTRAVENOUS

## 2022-05-15 MED ORDER — MIDAZOLAM HCL 2 MG/2ML IJ SOLN
INTRAMUSCULAR | Status: DC | PRN
  Administered 2022-05-15: 2 mg via INTRAVENOUS

## 2022-05-15 MED ORDER — DEXAMETHASONE SODIUM PHOSPHATE 10 MG/ML IJ SOLN
INTRAMUSCULAR | Status: DC | PRN
  Administered 2022-05-15: 10 mg via INTRAVENOUS

## 2022-05-15 MED ORDER — THROMBIN 5000 UNITS EX SOLR
OROMUCOSAL | Status: DC | PRN
  Administered 2022-05-15: 5 mL via TOPICAL

## 2022-05-15 MED ORDER — LIDOCAINE 2% (20 MG/ML) 5 ML SYRINGE
INTRAMUSCULAR | Status: AC
  Filled 2022-05-15: qty 5

## 2022-05-15 MED ORDER — MIDAZOLAM HCL 2 MG/2ML IJ SOLN
INTRAMUSCULAR | Status: AC
  Filled 2022-05-15: qty 2

## 2022-05-15 MED ORDER — CHLORHEXIDINE GLUCONATE CLOTH 2 % EX PADS: 6.0000 | MEDICATED_PAD | Freq: Once | CUTANEOUS | Status: DC

## 2022-05-15 MED ORDER — 0.9 % SODIUM CHLORIDE (POUR BTL) OPTIME
TOPICAL | Status: DC | PRN
  Administered 2022-05-15: 1000 mL

## 2022-05-15 MED ORDER — PROPOFOL 10 MG/ML IV BOLUS
INTRAVENOUS | Status: AC
  Filled 2022-05-15: qty 20

## 2022-05-15 MED ORDER — CEFAZOLIN SODIUM 1 G IJ SOLR
INTRAMUSCULAR | Status: AC
  Filled 2022-05-15: qty 30

## 2022-05-15 MED ORDER — FENTANYL CITRATE (PF) 250 MCG/5ML IJ SOLN
INTRAMUSCULAR | Status: AC
  Filled 2022-05-15: qty 5

## 2022-05-15 MED ORDER — OXYCODONE HCL 5 MG PO TABS: 5.0000 mg | ORAL_TABLET | Freq: Four times a day (QID) | ORAL | 0 refills | Status: AC | PRN

## 2022-05-15 MED ORDER — SUGAMMADEX SODIUM 200 MG/2ML IV SOLN
INTRAVENOUS | Status: DC | PRN
  Administered 2022-05-15: 400 mg via INTRAVENOUS

## 2022-05-15 MED ORDER — THROMBIN 5000 UNITS EX SOLR
CUTANEOUS | Status: AC
  Filled 2022-05-15: qty 5000

## 2022-05-15 MED ORDER — BUPIVACAINE HCL 0.5 % IJ SOLN
INTRAMUSCULAR | Status: DC | PRN
  Administered 2022-05-15: 10 mL

## 2022-05-15 MED ORDER — FENTANYL CITRATE (PF) 100 MCG/2ML IJ SOLN
25.0000 ug | INTRAMUSCULAR | Status: DC | PRN
  Administered 2022-05-15 (×3): 50 ug via INTRAVENOUS

## 2022-05-15 SURGICAL SUPPLY — 53 items
BAG COUNTER SPONGE SURGICOUNT (BAG) ×1 IMPLANT
BAND RUBBER #18 3X1/16 STRL (MISCELLANEOUS) ×2 IMPLANT
BENZOIN TINCTURE PRP APPL 2/3 (GAUZE/BANDAGES/DRESSINGS) IMPLANT
BLADE CLIPPER SURG (BLADE) IMPLANT
BLADE SURG 11 STRL SS (BLADE) ×1 IMPLANT
CANISTER SUCT 3000ML PPV (MISCELLANEOUS) ×1 IMPLANT
DERMABOND ADVANCED .7 DNX12 (GAUZE/BANDAGES/DRESSINGS) ×1 IMPLANT
DRAPE CAMERA VIDEO/LASER (DRAPES) ×1 IMPLANT
DRAPE HALF SHEET 40X57 (DRAPES) IMPLANT
DRAPE LAPAROTOMY 100X72 PEDS (DRAPES) ×1 IMPLANT
DRAPE MICROSCOPE SLANT 54X150 (MISCELLANEOUS) ×1 IMPLANT
DRSG OPSITE POSTOP 3X4 (GAUZE/BANDAGES/DRESSINGS) IMPLANT
DRSG OPSITE POSTOP 4X6 (GAUZE/BANDAGES/DRESSINGS) IMPLANT
DRSG TEGADERM 4X4.75 (GAUZE/BANDAGES/DRESSINGS) ×4 IMPLANT
DURAPREP 6ML APPLICATOR 50/CS (WOUND CARE) ×1 IMPLANT
ELECT COATED BLADE 2.86 ST (ELECTRODE) ×1 IMPLANT
ELECT REM PT RETURN 9FT ADLT (ELECTROSURGICAL) ×1
ELECTRODE REM PT RTRN 9FT ADLT (ELECTROSURGICAL) ×1 IMPLANT
GAUZE 4X4 16PLY ~~LOC~~+RFID DBL (SPONGE) IMPLANT
GENERATOR MODEL 106 ASPIRE (Neuro Prosthesis/Implant) IMPLANT
GLOVE BIO SURGEON STRL SZ7.5 (GLOVE) ×1 IMPLANT
GLOVE BIOGEL PI IND STRL 7.5 (GLOVE) ×2 IMPLANT
GLOVE ECLIPSE 7.0 STRL STRAW (GLOVE) ×1 IMPLANT
GOWN STRL REUS W/ TWL LRG LVL3 (GOWN DISPOSABLE) ×2 IMPLANT
GOWN STRL REUS W/ TWL XL LVL3 (GOWN DISPOSABLE) IMPLANT
GOWN STRL REUS W/TWL 2XL LVL3 (GOWN DISPOSABLE) IMPLANT
GOWN STRL REUS W/TWL LRG LVL3 (GOWN DISPOSABLE) ×2
GOWN STRL REUS W/TWL XL LVL3 (GOWN DISPOSABLE)
HEMOSTAT POWDER KIT SURGIFOAM (HEMOSTASIS) ×1 IMPLANT
KIT BASIN OR (CUSTOM PROCEDURE TRAY) ×1 IMPLANT
KIT TURNOVER KIT B (KITS) ×1 IMPLANT
LEAD PERENNIAFLEX 2-3 304 (Neuro Prosthesis/Implant) IMPLANT
LOOP VESSEL MAXI 1X406 BLUE (MISCELLANEOUS)
LOOP VESSEL MAXI BLUE (MISCELLANEOUS) IMPLANT
LOOP VESSEL MINI RED (MISCELLANEOUS) IMPLANT
NEEDLE HYPO 22GX1.5 SAFETY (NEEDLE) ×1 IMPLANT
NS IRRIG 1000ML POUR BTL (IV SOLUTION) ×1 IMPLANT
PACK LAMINECTOMY NEURO (CUSTOM PROCEDURE TRAY) ×1 IMPLANT
PAD ARMBOARD 7.5X6 YLW CONV (MISCELLANEOUS) ×3 IMPLANT
SCREW CORT NLOCK FT 3.5X70 (Screw) ×1 IMPLANT
SPIKE FLUID TRANSFER (MISCELLANEOUS) ×1 IMPLANT
SPONGE INTESTINAL PEANUT (DISPOSABLE) IMPLANT
SPONGE SURGIFOAM ABS GEL SZ50 (HEMOSTASIS) IMPLANT
SUT ETHILON 3 0 FSL (SUTURE) IMPLANT
SUT NURALON 4 0 TR CR/8 (SUTURE) ×1 IMPLANT
SUT VIC AB 0 CT1 27 (SUTURE) ×1
SUT VIC AB 0 CT1 27XBRD ANBCTR (SUTURE) ×1 IMPLANT
SUT VIC AB 3-0 SH 8-18 (SUTURE) ×1 IMPLANT
SUT VICRYL 3-0 RB1 18 ABS (SUTURE) ×2 IMPLANT
TOWEL GREEN STERILE (TOWEL DISPOSABLE) ×1 IMPLANT
TOWEL GREEN STERILE FF (TOWEL DISPOSABLE) ×1 IMPLANT
TUNNELING TOOL (MISCELLANEOUS) IMPLANT
WATER STERILE IRR 1000ML POUR (IV SOLUTION) ×1 IMPLANT
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
## 2022-05-15 NOTE — Op Note (Signed)
  NEUROSURGERY OPERATIVE NOTE   PREOP DIAGNOSIS: Medically intractable epilepsy   POSTOP DIAGNOSIS: Same  PROCEDURE: 1. Placement of left Vagal nerve stimulator  SURGEON: Dr. Lisbeth Renshaw, MD  ASSISTANT: Dr. Monia Pouch, MD  ANESTHESIA: General Endotracheal  EBL: Minimal  SPECIMENS: None  DRAINS: None  COMPLICATIONS: None immediate  CONDITION: Hemodynamically stable to PACU  HISTORY: Rhonda Schroeder is a 31 y.o.  female who was initially seen in the outpatient clinic as a referral from neurology for medically intractable epilepsy. She continues to have seizures despite AEDs. The patient appeared to be a good candidate for the procedure. The risks and benefits of the surgery were reviewed in detail. After all questions were answered, informed consent was obtained.  PROCEDURE IN DETAIL: After informed consent was obtained and witnessed, the patient was brought to the operating room. After induction of general anesthesia, the patient was positioned on the operative table in the supine position. All pressure points were meticulously padded. Skin incisions were then marked out and prepped and draped in the usual sterile fashion.  After timeout was conducted, left transverse neck skin incision was infiltrated with local anesthetic with epinephrine. Skin incision was then made sharply, and Bovie electrocautery was used to dissect the subcutaneous tissue. The platysma muscle was identified and incised. The platysma was then undermined. The medial border of the sternocleidomastoid muscle was identified, and dissection was carried out utilizing natural tissue planes of the neck until the carotid sheath was identified. The jugular vein was initially identified and the carotid sheath was incised. The vagus nerve was then identified running between the carotid and jugular. The vagus nerve was circumferentially dissected for length of a proximally 2-1/2 cm. The infraclavicular incision was then  infiltrated with local and opened sharply, and Bovie electrocautery was used to dissect the subcutaneous tissue. A subcutaneous pocket was then made. A subcutaneous tunneler was then passed from the neck to the infraclavicular incision. The vagal stimulator leads were then tunneled. The leads were then placed on the vagus nerve. A relaxing curl was then placed, and the leaves were tacked to the sternocleidomastoid muscle. The generator was then connected to the leads, and placed in the pocket.  Testing was done to confirm normal impedance, and good placement and heart rate detection.   Wounds were then irrigated with copious amounts of normal saline irrigation. Relaxing curl was placed on the neck leads and tacked to the SCM. The platysma was closed using interrupted 3-0 Vicryl stitches. Subcutaneous layer and the subclavicular incision was closed using interrupted 3-0 Vicryl stitches. Skin was then closed using interrupted 3-0 Vicryl, and a layer of Dermabond was applied.   At the end of the case all sponge, needle, cottonoid, and instrument counts were correct. The patient was then extubated, transferred to the stretcher, and taken to the postanesthesia care unit in stable hemodynamic condition.   Lisbeth Renshaw, MD [facility]

## 2022-05-15 NOTE — Discharge Summary (Addendum)
Physician Discharge Summary  Patient ID: Rhonda Schroeder MRN: [id number] DOB/AGE: 33-11-1990 33 y.o.  Admit date: 05/15/2022 Discharge date: 05/15/2022  Admission Diagnoses:  Medically intractable epilepsy  Discharge Diagnoses:  Same Active Problems:   * No active hospital problems. *   Discharged Condition: Stable  Hospital Course:  Rhonda Schroeder is a 31 y.o. female who underwent uncomplicated placement of a left VNS. She was at baseline postop and discharged home from PACU in stable condition.  Treatments: Surgery - Placement of left VNS  Discharge Exam: Blood pressure (!) 146/81, pulse 97, temperature 98.5 F (36.9 C), temperature source Oral, resp. rate 18, height 5\' 8"  (1.727 m), weight 127 kg, last menstrual period 05/12/2022, SpO2 98 %. Awake, alert, oriented Speech fluent, appropriate CN grossly intact 5/5 BUE/BLE Wound c/d/i  Disposition: Discharge disposition: 01-Home or Self Care       Discharge Instructions     Call MD for:  redness, tenderness, or signs of infection (pain, swelling, redness, odor or green/yellow discharge around incision site)   Complete by: As directed    Call MD for:  temperature >100.4   Complete by: As directed    Diet - low sodium heart healthy   Complete by: As directed    Discharge instructions   Complete by: As directed    Walk at home as much as possible, at least 4 times / day   Increase activity slowly   Complete by: As directed    Lifting restrictions   Complete by: As directed    No lifting > 10 lbs   May shower / Bathe   Complete by: As directed    48 hours after surgery   May walk up steps   Complete by: As directed    Other Restrictions   Complete by: As directed    No bending/twisting at waist   Remove dressing in 48 hours   Complete by: As directed       Allergies as of 05/15/2022   No Known Allergies      Medication List     TAKE these medications    acetaminophen 500 MG tablet Commonly  known as: TYLENOL Take 1,000 mg by mouth daily as needed for mild pain.   ARIPiprazole 5 MG tablet Commonly known as: ABILIFY Take 1 tablet (5 mg total) by mouth daily.   atomoxetine 40 MG capsule Commonly known as: Strattera Take 1 capsule (40 mg total) by mouth daily.   Baqsimi One Pack 3 MG/DOSE Powd Generic drug: Glucagon Place 1 spray into both nostrils as needed for low blood sugar.   benztropine 0.5 MG tablet Commonly known as: COGENTIN Take 1 tablet (0.5 mg total) by mouth at bedtime.   bisacodyl 5 MG EC tablet Commonly known as: DULCOLAX Take 5 mg by mouth daily as needed for moderate constipation.   divalproex 500 MG 24 hr tablet Commonly known as: DEPAKOTE ER TAKE 1 TABLET (500 MG TOTAL) BY MOUTH IN THE MORNING AND AT BEDTIME   Emgality 120 MG/ML Sosy Generic drug: Galcanezumab-gnlm Inject 1 Dose into the skin every 30 (thirty) days.   gabapentin 300 MG capsule Commonly known as: NEURONTIN Take 1 capsule (300 mg total) by mouth 2 (two) times daily.   gabapentin 300 MG capsule Commonly known as: NEURONTIN TAKE 1 CAPSULE BY MOUTH TWICE A DAY   HAIR/SKIN/NAILS PO Take 1 tablet by mouth daily. Hair, Skin and Nails gummy   ibuprofen 800 MG tablet Commonly known as: ADVIL TAKE  1 TABLET BY MOUTH EVERY 8 HOURS AS NEEDED   levETIRAcetam 1000 MG tablet Commonly known as: KEPPRA Take 1 and 1/2 tablets twice a day What changed:  how much to take how to take this when to take this additional instructions   meloxicam 15 MG tablet Commonly known as: MOBIC Take 15 mg by mouth daily after breakfast.   nortriptyline 50 MG capsule Commonly known as: PAMELOR Take 2 capsules (100 mg total) by mouth at bedtime.   OXcarbazepine ER 300 MG Tb24 Take 1 tablet every night (take with 600mg  tablet for total of 900mg  every night)   Oxtellar XR 600 MG Tb24 Generic drug: OXcarbazepine ER Take 1 tablet every night (take with 300mg  tablet for a total of 900mg  every  night)   oxyCODONE 5 MG immediate release tablet Commonly known as: Roxicodone Take 1 tablet (5 mg total) by mouth every 6 (six) hours as needed for up to 3 days for severe pain.   tiZANidine 4 MG tablet Commonly known as: Zanaflex Take 1 tablet (4 mg total) by mouth every 8 (eight) hours as needed for muscle spasms.   Valtoco 20 MG Dose 2 x 10 MG/0.1ML Lqpk Generic drug: diazePAM (20 MG Dose) Spray in each nostril as instructed for seizure rescue. May give second dose after 4 hours if needed.        Follow-up Information     , MD Follow up in 2 week(s).   Specialty: Neurosurgery Contact information: [address] [place] Lisbeth Renshaw [phone]                 Signed: [place] 05/15/2022, 4:52 PM

## 2022-05-15 NOTE — Transfer of Care (Signed)
Immediate Anesthesia Transfer of Care Note  Patient: Rhonda Schroeder  Procedure(s) Performed: VAGAL NERVE STIMULATOR IMPLANT (Left)  Patient Location: PACU  Anesthesia Type:General  Level of Consciousness: drowsy and patient cooperative  Airway & Oxygen Therapy: Patient Spontanous Breathing  Post-op Assessment: Report given to RN and Post -op Vital signs reviewed and stable  Post vital signs: Reviewed and stable  Last Vitals:  Vitals Value Taken Time  BP 137/67   Temp    Pulse 94   Resp 11   SpO2 97     Last Pain:  Vitals:   05/15/22 0952  TempSrc:   PainSc: 0-No pain         Complications: No notable events documented.

## 2022-05-15 NOTE — Anesthesia Procedure Notes (Signed)
Procedure Name: Intubation Date/Time: 05/15/2022 2:48 PM  Performed by: Inda Coke, CRNAPre-anesthesia Checklist: Patient identified, Emergency Drugs available, Suction available, Timeout performed and Patient being monitored Patient Re-evaluated:Patient Re-evaluated prior to induction Oxygen Delivery Method: Circle system utilized Preoxygenation: Pre-oxygenation with 100% oxygen Induction Type: IV induction Ventilation: Mask ventilation without difficulty Laryngoscope Size: Mac and 3 Grade View: Grade I Tube type: Oral Tube size: 7.0 mm Number of attempts: 1 Airway Equipment and Method: Stylet Placement Confirmation: ETT inserted through vocal cords under direct vision, positive ETCO2, CO2 detector and breath sounds checked- equal and bilateral Secured at: 22 cm Tube secured with: Tape Dental Injury: Teeth and Oropharynx as per pre-operative assessment

## 2022-05-21 ENCOUNTER — Encounter (HOSPITAL_COMMUNITY): Payer: Self-pay | Admitting: Psychiatry

## 2022-05-21 ENCOUNTER — Telehealth (HOSPITAL_BASED_OUTPATIENT_CLINIC_OR_DEPARTMENT_OTHER): Payer: Medicaid Other | Admitting: Psychiatry

## 2022-05-21 DIAGNOSIS — F319 Bipolar disorder, unspecified: Secondary | ICD-10-CM | POA: Diagnosis not present

## 2022-05-21 DIAGNOSIS — F4310 Post-traumatic stress disorder, unspecified: Secondary | ICD-10-CM | POA: Diagnosis not present

## 2022-05-21 MED ORDER — NORTRIPTYLINE HCL 50 MG PO CAPS: 100.0000 mg | ORAL_CAPSULE | Freq: Every day | ORAL | 2 refills | Status: DC

## 2022-05-21 MED ORDER — BENZTROPINE MESYLATE 0.5 MG PO TABS: 0.5000 mg | ORAL_TABLET | Freq: Every day | ORAL | 2 refills | Status: DC

## 2022-05-21 MED ORDER — ARIPIPRAZOLE 5 MG PO TABS: 5.0000 mg | ORAL_TABLET | Freq: Every day | ORAL | 2 refills | Status: DC

## 2022-05-21 NOTE — Telephone Encounter (Signed)
PA FOR ABILIFY 5 MG TABS APPROVED THROUGH 05/15/23

## 2022-05-21 NOTE — Anesthesia Postprocedure Evaluation (Signed)
Anesthesia Post Note  Patient: Rhonda Schroeder  Procedure(s) Performed: VAGAL NERVE STIMULATOR IMPLANT (Left)     Patient location during evaluation: PACU Anesthesia Type: General Level of consciousness: awake and alert Pain management: pain level controlled Vital Signs Assessment: post-procedure vital signs reviewed and stable Respiratory status: spontaneous breathing, nonlabored ventilation and respiratory function stable Cardiovascular status: stable and blood pressure returned to baseline Anesthetic complications: no   No notable events documented.  Last Vitals:  Vitals:   05/15/22 1715 05/15/22 1730  BP: 131/68 126/70  Pulse: (!) 101 99  Resp: 14 15  Temp:  36.4 C  SpO2: 93% 95%    Last Pain:  Vitals:   05/15/22 1730  TempSrc:   PainSc: 2    Pain Goal:                   Beryle Lathe
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
## 2022-05-25 ENCOUNTER — Encounter (HOSPITAL_COMMUNITY): Payer: Self-pay | Admitting: Neurosurgery

## 2022-05-31 ENCOUNTER — Other Ambulatory Visit: Payer: Self-pay | Admitting: Neurology

## 2022-06-01 ENCOUNTER — Ambulatory Visit (INDEPENDENT_AMBULATORY_CARE_PROVIDER_SITE_OTHER): Payer: Medicaid Other | Admitting: Neurology

## 2022-06-01 ENCOUNTER — Encounter: Payer: Self-pay | Admitting: Neurology

## 2022-06-01 VITALS — BP 136/84 | HR 94 | Ht 68.0 in | Wt 291.2 lb

## 2022-06-01 DIAGNOSIS — G40009 Localization-related (focal) (partial) idiopathic epilepsy and epileptic syndromes with seizures of localized onset, not intractable, without status epilepticus: Secondary | ICD-10-CM
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
## 2022-06-07 ENCOUNTER — Other Ambulatory Visit: Payer: Self-pay | Admitting: Neurology

## 2022-06-08 ENCOUNTER — Telehealth (HOSPITAL_COMMUNITY): Payer: Self-pay | Admitting: *Deleted

## 2022-06-08 DIAGNOSIS — F319 Bipolar disorder, unspecified: Secondary | ICD-10-CM

## 2022-06-08 NOTE — Telephone Encounter (Signed)
Pt called requesting increasing Cogentin, currently on 0.5 mg qhs, or changing medication as she has had an increase in tremors. Pt next appointment scheduled for 08/18/22. Please review and advise.

## 2022-06-10 MED ORDER — BENZTROPINE MESYLATE 1 MG PO TABS: 1.0000 mg | ORAL_TABLET | Freq: Every day | ORAL | 0 refills | Status: DC

## 2022-06-10 NOTE — Telephone Encounter (Signed)
Increased dose of Cogentin 1 mg at bedtime sent to her local pharmacy.  Please notify the patient.

## 2022-06-15 ENCOUNTER — Ambulatory Visit (INDEPENDENT_AMBULATORY_CARE_PROVIDER_SITE_OTHER): Payer: Medicaid Other | Admitting: Neurology

## 2022-06-15 ENCOUNTER — Encounter: Payer: Self-pay | Admitting: Neurology

## 2022-06-15 VITALS — BP 135/82 | HR 89 | Ht 68.0 in | Wt 293.4 lb

## 2022-06-15 DIAGNOSIS — G40009 Localization-related (focal) (partial) idiopathic epilepsy and epileptic syndromes with seizures of localized onset, not intractable, without status epilepticus: Secondary | ICD-10-CM

## 2022-06-15 MED ORDER — OXTELLAR XR 600 MG PO TB24: ORAL_TABLET | ORAL | 3 refills | Status: DC
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
## 2022-06-16 ENCOUNTER — Other Ambulatory Visit: Payer: Self-pay | Admitting: Neurology

## 2022-07-07 ENCOUNTER — Other Ambulatory Visit (HOSPITAL_COMMUNITY): Payer: Self-pay | Admitting: Psychiatry

## 2022-07-07 DIAGNOSIS — F319 Bipolar disorder, unspecified: Secondary | ICD-10-CM

## 2022-07-08 ENCOUNTER — Ambulatory Visit (INDEPENDENT_AMBULATORY_CARE_PROVIDER_SITE_OTHER): Payer: Medicaid Other | Admitting: Neurology

## 2022-07-08 ENCOUNTER — Encounter: Payer: Self-pay | Admitting: Neurology

## 2022-07-08 ENCOUNTER — Other Ambulatory Visit: Payer: Self-pay | Admitting: Neurology

## 2022-07-08 VITALS — BP 140/82 | HR 95 | Ht 68.0 in | Wt 294.2 lb

## 2022-07-08 DIAGNOSIS — G40009 Localization-related (focal) (partial) idiopathic epilepsy and epileptic syndromes with seizures of localized onset, not intractable, without status epilepticus: Secondary | ICD-10-CM
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
## 2022-07-15 ENCOUNTER — Encounter: Payer: Self-pay | Admitting: Neurology

## 2022-07-16 ENCOUNTER — Telehealth (HOSPITAL_COMMUNITY): Payer: Self-pay | Admitting: *Deleted

## 2022-07-16 NOTE — Telephone Encounter (Signed)
She can try REXULTI which I have offered last time.  If she agree please provide samples 0.5 mg for 1 week and then 1 mg daily.  If patient cannot come to pick up the samples then please call to her local pharmacy.  She will stop the Abilify if she decided to try [place].

## 2022-07-16 NOTE — Telephone Encounter (Signed)
Pt called with c/o increased irritability and mood swings. Pt also c/o some anxiety as well. She is asking that you perhaps use a different mood stabilizer. Pt last seen on 05/21/22 and has an upcoming appointment on 08/18/22. Please review and advise.

## 2022-07-17 ENCOUNTER — Other Ambulatory Visit (HOSPITAL_COMMUNITY): Payer: Self-pay | Admitting: Psychiatry

## 2022-07-17 ENCOUNTER — Other Ambulatory Visit (HOSPITAL_COMMUNITY): Payer: Self-pay | Admitting: *Deleted

## 2022-07-17 MED ORDER — REXULTI 0.5 MG PO TABS: 0.5000 mg | ORAL_TABLET | Freq: Every day | ORAL | 0 refills | Status: DC

## 2022-07-17 MED ORDER — BREXPIPRAZOLE 1 MG PO TABS: 1.0000 mg | ORAL_TABLET | Freq: Every day | ORAL | 0 refills | Status: DC

## 2022-07-19 ENCOUNTER — Encounter: Payer: Self-pay | Admitting: Neurology

## 2022-07-24 ENCOUNTER — Encounter: Payer: Self-pay | Admitting: Neurology

## 2022-07-29 ENCOUNTER — Ambulatory Visit: Payer: Medicaid Other | Admitting: Neurology

## 2022-07-30 ENCOUNTER — Encounter: Payer: Self-pay | Admitting: Neurology

## 2022-08-04 ENCOUNTER — Ambulatory Visit: Payer: Medicaid Other | Admitting: Neurology

## 2022-08-04 ENCOUNTER — Encounter: Payer: Self-pay | Admitting: Neurology

## 2022-08-04 VITALS — BP 122/79 | HR 91 | Ht 68.0 in | Wt 303.8 lb

## 2022-08-04 DIAGNOSIS — F4310 Post-traumatic stress disorder, unspecified: Secondary | ICD-10-CM | POA: Diagnosis not present

## 2022-08-04 DIAGNOSIS — G40009 Localization-related (focal) (partial) idiopathic epilepsy and epileptic syndromes with seizures of localized onset, not intractable, without status epilepticus: Secondary | ICD-10-CM

## 2022-08-04 MED ORDER — OXTELLAR XR 600 MG PO TB24: ORAL_TABLET | ORAL | 3 refills | Status: DC

## 2022-08-04 MED ORDER — NORTRIPTYLINE HCL 50 MG PO CAPS: ORAL_CAPSULE | ORAL | 6 refills | Status: DC

## 2022-08-04 MED ORDER — LEVETIRACETAM 1000 MG PO TABS: ORAL_TABLET | ORAL | 3 refills | Status: DC

## 2022-08-04 MED ORDER — EMGALITY 120 MG/ML ~~LOC~~ SOSY: 1.0000 | PREFILLED_SYRINGE | SUBCUTANEOUS | 11 refills | Status: DC

## 2022-08-04 MED ORDER — DIVALPROEX SODIUM ER 500 MG PO TB24: 500.0000 mg | ORAL_TABLET | Freq: Two times a day (BID) | ORAL | 3 refills | Status: DC
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
## 2022-08-04 NOTE — Patient Instructions (Addendum)
Good to see you.  Increase the nortriptyline 50mg : take 3 capsules every night and see if this helps with headaches and sleep  2. Minimize Tylenol, Fioricet, or any other pain medication to 2-3 times a week to avoid rebound headaches  3. Continue all your seizures medications  4. Follow-up on Feb 20 at 1pm

## 2022-08-15 ENCOUNTER — Encounter: Payer: Self-pay | Admitting: Neurology

## 2022-08-18 ENCOUNTER — Encounter (HOSPITAL_COMMUNITY): Payer: Self-pay | Admitting: Psychiatry

## 2022-08-18 ENCOUNTER — Other Ambulatory Visit (HOSPITAL_COMMUNITY): Payer: Self-pay | Admitting: Psychiatry

## 2022-08-18 ENCOUNTER — Telehealth (HOSPITAL_BASED_OUTPATIENT_CLINIC_OR_DEPARTMENT_OTHER): Payer: Medicaid Other | Admitting: Psychiatry

## 2022-08-18 ENCOUNTER — Other Ambulatory Visit: Payer: Self-pay | Admitting: Neurology

## 2022-08-18 VITALS — Wt 303.0 lb

## 2022-08-18 DIAGNOSIS — F4310 Post-traumatic stress disorder, unspecified: Secondary | ICD-10-CM | POA: Diagnosis not present

## 2022-08-18 DIAGNOSIS — F900 Attention-deficit hyperactivity disorder, predominantly inattentive type: Secondary | ICD-10-CM

## 2022-08-18 DIAGNOSIS — F319 Bipolar disorder, unspecified: Secondary | ICD-10-CM

## 2022-08-18 MED ORDER — ARIPIPRAZOLE 5 MG PO TABS: 5.0000 mg | ORAL_TABLET | Freq: Every day | ORAL | 0 refills | Status: DC

## 2022-08-18 MED ORDER — BENZTROPINE MESYLATE 1 MG PO TABS: 1.0000 mg | ORAL_TABLET | Freq: Every day | ORAL | 0 refills | Status: DC

## 2022-08-18 MED ORDER — OXTELLAR XR 600 MG PO TB24: ORAL_TABLET | ORAL | 3 refills | Status: DC
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
## 2022-08-25 ENCOUNTER — Ambulatory Visit (INDEPENDENT_AMBULATORY_CARE_PROVIDER_SITE_OTHER): Payer: Medicaid Other | Admitting: Neurology

## 2022-08-25 ENCOUNTER — Encounter: Payer: Self-pay | Admitting: Neurology

## 2022-08-25 VITALS — BP 100/65 | HR 92 | Ht 68.0 in | Wt 303.0 lb

## 2022-08-25 DIAGNOSIS — G40009 Localization-related (focal) (partial) idiopathic epilepsy and epileptic syndromes with seizures of localized onset, not intractable, without status epilepticus: Secondary | ICD-10-CM | POA: Diagnosis not present

## 2022-08-25 NOTE — Patient Instructions (Signed)
Wishing you well with your upcoming follow-ups. Continue all your medications, let me know if you need refills. Continue swiping magnet twice a day and as needed. Follow-up on March 5 at 1pm, call for any changes   Seizure Precautions: 1. If medication has been prescribed for you to prevent seizures, take it exactly as directed.  Do not stop taking the medicine without talking to your doctor first, even if you have not had a seizure in a long time.   2. Avoid activities in which a seizure would cause danger to yourself or to others.  Don't operate dangerous machinery, swim alone, or climb in high or dangerous places, such as on ladders, roofs, or girders.  Do not drive unless your doctor says you may.  3. If you have any warning that you may have a seizure, lay down in a safe place where you can't hurt yourself.    4.  No driving for 6 months from last seizure, as per [facility].   Please refer to the following link on the [place] website for more information: http://[email or web address]/[email or web address]/driving/drivingu.cfm   5.  Maintain good sleep hygiene. Avoid alcohol.  6.  Notify your neurology if you are planning pregnancy or if you become pregnant.  7.  Contact your doctor if you have any problems that may be related to the medicine you are taking.  8.  Call 911 and bring the patient back to the ED if:        A.  The seizure lasts longer than 5 minutes.       B.  The patient doesn't awaken shortly after the seizure  C.  The patient has new problems such as difficulty seeing, speaking or moving  D.  The patient was injured during the seizure  E.  The patient has a temperature over 102 F (39C)  F.  The patient vomited and now is having trouble breathing
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
## 2022-08-29 ENCOUNTER — Encounter: Payer: Self-pay | Admitting: Neurology

## 2022-08-30 ENCOUNTER — Encounter: Payer: Self-pay | Admitting: Neurology

## 2022-09-01 ENCOUNTER — Telehealth: Payer: Medicaid Other | Admitting: Nurse Practitioner

## 2022-09-01 DIAGNOSIS — J0110 Acute frontal sinusitis, unspecified: Secondary | ICD-10-CM

## 2022-09-01 MED ORDER — AMOXICILLIN-POT CLAVULANATE 875-125 MG PO TABS: 1.0000 | ORAL_TABLET | Freq: Two times a day (BID) | ORAL | 0 refills | Status: AC

## 2022-09-01 NOTE — Progress Notes (Signed)
E-Visit for Sinus Problems  We are sorry that you are not feeling well.  Here is how we plan to help!  Based on what you have shared with me it looks like you have sinusitis.  Sinusitis is inflammation and infection in the sinus cavities of the head.  Based on your presentation I believe you most likely have Acute Bacterial Sinusitis.  This is an infection caused by bacteria and is treated with antibiotics. I have prescribed Augmentin '875mg'$ /'125mg'$  one tablet twice daily with food, for 7 days. You may use an oral decongestant such as Mucinex D or if you have glaucoma or high blood pressure use plain Mucinex. Saline nasal spray help and can safely be used as often as needed for congestion.  If you develop worsening sinus pain, fever or notice severe headache and vision changes, or if symptoms are not better after completion of antibiotic, please schedule an appointment with a health care provider.    Sinus infections are not as easily transmitted as other respiratory infection, however we still recommend that you avoid close contact with loved ones, especially the very young and elderly.  Remember to wash your hands thoroughly throughout the day as this is the number one way to prevent the spread of infection!  Home Care: Only take medications as instructed by your medical team. Complete the entire course of an antibiotic. Do not take these medications with alcohol. A steam or ultrasonic humidifier can help congestion.  You can place a towel over your head and breathe in the steam from hot water coming from a faucet. Avoid close contacts especially the very young and the elderly. Cover your mouth when you cough or sneeze. Always remember to wash your hands.  Get Help Right Away If: You develop worsening fever or sinus pain. You develop a severe head ache or visual changes. Your symptoms persist after you have completed your treatment plan.  Make sure you Understand these instructions. Will watch  your condition. Will get help right away if you are not doing well or get worse.  Thank you for choosing an e-visit.  Your e-visit answers were reviewed by a board certified advanced clinical practitioner to complete your personal care plan. Depending upon the condition, your plan could have included both over the counter or prescription medications.  Please review your pharmacy choice. Make sure the pharmacy is open so you can pick up prescription now. If there is a problem, you may contact your provider through [employer] and have the prescription routed to another pharmacy.  Your safety is important to [place]. If you have drug allergies check your prescription carefully.   For the next 24 hours you can use MyChart to ask questions about today's visit, request a non-urgent call back, or ask for a work or school excuse. You will get an email in the next two days asking about your experience. I hope that your e-visit has been valuable and will speed your recovery.   Meds ordered this encounter  Medications   amoxicillin-clavulanate (AUGMENTIN) 875-125 MG tablet    Sig: Take 1 tablet by mouth 2 (two) times daily for 7 days. Take with food    Dispense:  14 tablet    Refill:  0    I spent approximately 5 minutes reviewing the patient's history, current symptoms and coordinating their care today.

## 2022-09-02 ENCOUNTER — Other Ambulatory Visit: Payer: Self-pay

## 2022-09-02 ENCOUNTER — Encounter: Payer: Self-pay | Admitting: Neurology

## 2022-09-02 DIAGNOSIS — G40009 Localization-related (focal) (partial) idiopathic epilepsy and epileptic syndromes with seizures of localized onset, not intractable, without status epilepticus: Secondary | ICD-10-CM

## 2022-09-02 DIAGNOSIS — G40309 Generalized idiopathic epilepsy and epileptic syndromes, not intractable, without status epilepticus: Secondary | ICD-10-CM

## 2022-09-08 ENCOUNTER — Encounter: Payer: Self-pay | Admitting: Neurology

## 2022-09-08 ENCOUNTER — Other Ambulatory Visit (INDEPENDENT_AMBULATORY_CARE_PROVIDER_SITE_OTHER): Payer: Medicaid Other

## 2022-09-08 ENCOUNTER — Ambulatory Visit: Payer: Medicaid Other | Admitting: Neurology

## 2022-09-08 VITALS — BP 135/84 | HR 96 | Ht 68.0 in | Wt 314.2 lb

## 2022-09-08 DIAGNOSIS — G43009 Migraine without aura, not intractable, without status migrainosus: Secondary | ICD-10-CM

## 2022-09-08 DIAGNOSIS — G40009 Localization-related (focal) (partial) idiopathic epilepsy and epileptic syndromes with seizures of localized onset, not intractable, without status epilepticus: Secondary | ICD-10-CM
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
## 2022-09-09 LAB — ANA: Anti Nuclear Antibody (ANA): NEGATIVE

## 2022-09-09 NOTE — Addendum Note (Signed)
Addended by: Jake Seats on: 09/09/2022 07:47 AM   Modules accepted: Orders

## 2022-09-11 ENCOUNTER — Other Ambulatory Visit: Payer: Medicaid Other

## 2022-09-11 ENCOUNTER — Other Ambulatory Visit: Payer: Self-pay

## 2022-09-11 ENCOUNTER — Telehealth: Payer: Self-pay

## 2022-09-11 NOTE — Telephone Encounter (Signed)
[facility] is calling asking for information about the sample of when they should expect it.

## 2022-09-11 NOTE — Telephone Encounter (Signed)
Asking Rhonda Schroeder if they did thi lab work

## 2022-09-11 NOTE — Telephone Encounter (Signed)
Pt is coming in today to get lab work done

## 2022-09-14 ENCOUNTER — Other Ambulatory Visit (HOSPITAL_COMMUNITY): Payer: Self-pay | Admitting: Psychiatry

## 2022-09-14 DIAGNOSIS — F319 Bipolar disorder, unspecified: Secondary | ICD-10-CM

## 2022-09-14 DIAGNOSIS — F900 Attention-deficit hyperactivity disorder, predominantly inattentive type: Secondary | ICD-10-CM

## 2022-09-14 DIAGNOSIS — F4310 Post-traumatic stress disorder, unspecified: Secondary | ICD-10-CM

## 2022-09-16 ENCOUNTER — Telehealth (HOSPITAL_BASED_OUTPATIENT_CLINIC_OR_DEPARTMENT_OTHER): Payer: Medicaid Other | Admitting: Psychiatry

## 2022-09-16 ENCOUNTER — Encounter (HOSPITAL_COMMUNITY): Payer: Self-pay | Admitting: Psychiatry

## 2022-09-16 VITALS — Wt 314.0 lb

## 2022-09-16 DIAGNOSIS — F900 Attention-deficit hyperactivity disorder, predominantly inattentive type: Secondary | ICD-10-CM

## 2022-09-16 DIAGNOSIS — F319 Bipolar disorder, unspecified: Secondary | ICD-10-CM | POA: Diagnosis not present

## 2022-09-16 DIAGNOSIS — R251 Tremor, unspecified: Secondary | ICD-10-CM | POA: Diagnosis not present

## 2022-09-16 DIAGNOSIS — F4310 Post-traumatic stress disorder, unspecified: Secondary | ICD-10-CM

## 2022-09-16 MED ORDER — BENZTROPINE MESYLATE 1 MG PO TABS: 1.0000 mg | ORAL_TABLET | Freq: Every day | ORAL | 1 refills | Status: AC

## 2022-09-16 MED ORDER — ARIPIPRAZOLE 5 MG PO TABS: 5.0000 mg | ORAL_TABLET | Freq: Every day | ORAL | 1 refills | Status: DC

## 2022-09-16 NOTE — Progress Notes (Signed)
[facility] MD Virtual Progress Note   Patient Location: Home Provider Location: Home Office  I connect with patient by video and verified that I am speaking with correct person by using two identifiers. I discussed the limitations of evaluation and management by telemedicine and the availability of in person appointments. I also discussed with the patient that there may be a patient responsible charge related to this service. The patient expressed understanding and agreed to proceed.  Rhonda Schroeder [id number] 32 y.o.  09/16/2022 1:48 PM  History of Present Illness:  Patient is evaluated by video session.  She reported had a difficult time handling her chronic medical health issues.  She continues to have seizures and last seizure 2 days ago.  She saw a neurologist and she has a appointment coming up to optimize VNS.  This patient was told that she may need in the hospital for further studies.  On the last visit we discontinue REXULTI but it was expensive and she could not afford and be switched to Abilify.  She thought the Abilify caused weight gain otherwise it had worked better.  So far no significant weight gain or weight loss but her appetite is fluctuate.  She is not sure what triggered but started to have low blood sugar and despite eating sometimes she rechecked her blood sugar which is still low.  Today I noticed patient has redness on her face and forehead.  She is not sure what triggered that and she denies using any make-up recently, sitting on the sun.  She also seen in the emergency room month ago for blood and rectum.  She has not seen therapist but she had promised that she will contact one of her daughters therapist to see if she can find someone in their office.  She noticed her tremors started to get worse.  Sometime her mother has to hold her hands because she gets very tremulous.  She is not sure if Abilify causing but admitted tremors has been for a while but  lately got worse.  She is taking Cogentin but does not feel it help as much.  She also noticed forgetful easily and wondering if she can go back on ADHD medication.  Sometimes she loses her train thoughts she feels her depression is much better and is stable since taking the Abilify.  She denies any suicidal thoughts, crying spells.  She sleeps okay.  She still have occasionally nightmares and flashback but hoping to see a therapist help these chronic PTSD symptoms.  Past Psychiatric History:    Outpatient Encounter Medications as of 09/16/2022  Medication Sig   acetaminophen (TYLENOL) 500 MG tablet Take 1,000 mg by mouth daily as needed for mild pain.   ARIPiprazole (ABILIFY) 5 MG tablet Take 1 tablet (5 mg total) by mouth daily.   BAQSIMI ONE PACK 3 MG/DOSE POWD Place 1 spray into both nostrils as needed for low blood sugar.   benztropine (COGENTIN) 1 MG tablet Take 1 tablet (1 mg total) by mouth at bedtime.   Biotin w/ Vitamins C & E (HAIR/SKIN/NAILS PO) Take 1 tablet by mouth daily. Hair, Skin and Nails gummy   bisacodyl (DULCOLAX) 5 MG EC tablet Take 5 mg by mouth daily as needed for moderate constipation.   butalbital-acetaminophen-caffeine (FIORICET) 50-325-40 MG tablet Take 1 tablet by mouth every 6 (six) hours as needed.   cholecalciferol (VITAMIN D3) 25 MCG (1000 UNIT) tablet Take 50,000 Units by mouth daily. One time weekly   diazePAM,  20 MG Dose, (VALTOCO 20 MG DOSE) 2 x 10 MG/0.1ML LQPK Spray in each nostril as instructed for seizure rescue. May give second dose after 4 hours if needed.   divalproex (DEPAKOTE ER) 500 MG 24 hr tablet Take 1 tablet (500 mg total) by mouth in the morning and at bedtime.   furosemide (LASIX) 20 MG tablet Take 20 mg by mouth.   Galcanezumab-gnlm (EMGALITY) 120 MG/ML SOSY Inject 1 Dose into the skin every 30 (thirty) days.   ibuprofen (ADVIL) 800 MG tablet TAKE 1 TABLET BY MOUTH EVERY 8 HOURS AS NEEDED   levETIRAcetam (KEPPRA) 1000 MG tablet Take 1 and 1/2  tablets twice a day   lisinopril (ZESTRIL) 5 MG tablet Take 5 mg by mouth daily.   meloxicam (MOBIC) 15 MG tablet Take 15 mg by mouth daily after breakfast.   nortriptyline (PAMELOR) 50 MG capsule Take 3 capsules at bedtime   OXcarbazepine ER (OXTELLAR XR) 600 MG TB24 Take 1 tablet every morning, take 2 tablets every night   silver sulfADIAZINE (SILVADENE) 1 % cream    No facility-administered encounter medications on file as of 09/16/2022.    Recent Results (from the past 2160 hour(s))  ANA     Status: None   Collection Time: 09/08/22  2:03 PM  Result Value Ref Range   Anti Nuclear Antibody (ANA) NEGATIVE NEGATIVE    Comment: ANA IFA is a first line screen for detecting the presence of up to approximately 150 autoantibodies in various autoimmune diseases. A negative ANA IFA result suggests an ANA-associated autoimmune disease is not present at this time, but is not definitive. If there is high clinical suspicion for Sjogren's syndrome, testing for anti-SS-A/Ro antibody should be considered. Anti-Jo-1 antibody should be considered for clinically suspected inflammatory myopathies. . AC-0: Negative . International Consensus on ANA Patterns (https://www.hernandez-brewer.com/) . For additional information, please refer to http://education.[employer].com/faq/FAQ177 (This link is being provided for informational/ educational purposes only.) .      Psychiatric Specialty Exam: Physical Exam  Review of Systems  Constitutional:        Fascial redness    Weight (!) 314 lb (142.4 kg).There is no height or weight on file to calculate BMI.  General Appearance: Casual and refness on face and forhead  Eye Contact:  Fair  Speech:  Slow  Volume:  Decreased  Mood:  Anxious  Affect:  Congruent  Thought Process:  Descriptions of Associations: Intact  Orientation:  Full (Time, Place, and Person)  Thought Content:  Rumination  Suicidal Thoughts:  No  Homicidal Thoughts:  No   Memory:  Immediate;   Good Recent;   Fair Remote;   Fair  Judgement:  Intact  Insight:  Present  Psychomotor Activity:  Decreased and Tremor  Concentration:  Concentration: Fair and Attention Span: Fair  Recall:  Good  Fund of Knowledge:  Good  Language:  Good  Akathisia:  No  Handed:  Right  AIMS (if indicated):     Assets:  Communication Skills Desire for Improvement Housing Social Support  ADL's:  Intact  Cognition:  WNL  Sleep:  fair     Assessment/Plan: Bipolar I disorder (HCC) - Plan: ARIPiprazole (ABILIFY) 5 MG tablet, benztropine (COGENTIN) 1 MG tablet  PTSD (post-traumatic stress disorder) - Plan: ARIPiprazole (ABILIFY) 5 MG tablet  Attention deficit hyperactivity disorder (ADHD), predominantly inattentive type - Plan: ARIPiprazole (ABILIFY) 5 MG tablet  Tremor - Plan: benztropine (COGENTIN) 1 MG tablet  I reviewed notes from other provider, blood work results  from recent emergency room visit.  Her sodium is 124.  She also have autoimmune disease workup.  Her biggest concern is continuing to have seizures and despite taking multiple medication and have the VNS it is not well-controlled.  She may need further workup from neurology.  She liked Abilify and will keep the same dose for now.  I recommend to try Cogentin 2 mg at bedtime to help the tremors.  Patient like to go back on Strattera however given the medical complex condition I will defer it until neurology cleared that she can take the Strattera.  I explained most of the ADHD medications sometimes cause worsening of seizures.  Patient agreed with the plan.  I emphasized that she need to see a therapist to help the PTSD symptoms.  Her mania and depression is stable on Abilify.  Recommend to call us back if she is any question or any concern.  Follow-up in 2 months.   Follow Up Instructions:     I discussed the assessment and treatment plan with the patient. The patient was provided an opportunity to ask questions  and all were answered. The patient agreed with the plan and demonstrated an understanding of the instructions.   The patient was advised to call back or seek an in-person evaluation if the symptoms worsen or if the condition fails to improve as anticipated.    Collaboration of Care: Other provider involved in patient's care AEB notes are available in epic to review.  Patient/Guardian was advised Release of Information must be obtained prior to any record release in order to collaborate their care with an outside provider. Patient/Guardian was advised if they have not already done so to contact the registration department to sign all necessary forms in order for [place] to release information regarding their care.   Consent: Patient/Guardian gives verbal consent for treatment and assignment of benefits for services provided during this visit. Patient/Guardian expressed understanding and agreed to proceed.     I provided 28 minutes of non face to face time during this encounter.  Kathlee Nations, MD 09/16/2022

## 2022-09-17 ENCOUNTER — Other Ambulatory Visit (HOSPITAL_COMMUNITY): Payer: Self-pay | Admitting: Psychiatry

## 2022-09-17 DIAGNOSIS — F319 Bipolar disorder, unspecified: Secondary | ICD-10-CM

## 2022-09-18 ENCOUNTER — Other Ambulatory Visit: Payer: Self-pay | Admitting: Neurology

## 2022-09-22 ENCOUNTER — Other Ambulatory Visit: Payer: Self-pay | Admitting: Neurology

## 2022-09-23 ENCOUNTER — Other Ambulatory Visit: Payer: Self-pay | Admitting: Neurology

## 2022-09-23 DIAGNOSIS — F4310 Post-traumatic stress disorder, unspecified: Secondary | ICD-10-CM

## 2022-09-24 ENCOUNTER — Telehealth: Payer: Self-pay

## 2022-09-24 ENCOUNTER — Encounter: Payer: Self-pay | Admitting: Neurology

## 2022-09-24 NOTE — Telephone Encounter (Signed)
Error

## 2022-09-27 ENCOUNTER — Encounter: Payer: Self-pay | Admitting: Neurology

## 2022-09-28 ENCOUNTER — Telehealth: Payer: Self-pay

## 2022-09-28 NOTE — Telephone Encounter (Signed)
-----   Message from Cameron Sprang, MD sent at 09/27/2022  6:25 PM EDT ----- Pls let her know the bloodwork was normal, no inflammation or autoimmune abnormality seen. Thanks

## 2022-09-28 NOTE — Telephone Encounter (Signed)
Pt called no answer left a voice mail per DPR that the bloodwork was normal, no inflammation or autoimmune abnormality seen

## 2022-10-10 ENCOUNTER — Other Ambulatory Visit (HOSPITAL_COMMUNITY): Payer: Self-pay | Admitting: Psychiatry

## 2022-10-10 DIAGNOSIS — R251 Tremor, unspecified: Secondary | ICD-10-CM

## 2022-10-10 DIAGNOSIS — F319 Bipolar disorder, unspecified: Secondary | ICD-10-CM

## 2022-10-13 ENCOUNTER — Ambulatory Visit: Payer: Medicaid Other | Admitting: Neurology

## 2022-10-19 ENCOUNTER — Encounter: Payer: Self-pay | Admitting: Neurology

## 2022-10-22 MED ORDER — VALTOCO 20 MG DOSE 10 MG/0.1ML NA LQPK: NASAL | 5 refills | Status: DC

## 2022-10-23 ENCOUNTER — Other Ambulatory Visit: Payer: Self-pay

## 2022-10-23 MED ORDER — OXTELLAR XR 600 MG PO TB24: ORAL_TABLET | ORAL | 0 refills | Status: DC

## 2022-10-23 NOTE — Telephone Encounter (Signed)
MEDICATION: OXcarbazepine ER (OXTELLAR XR) 600 MG TB24   PHARMACY: [id number] - [place], [place] - [address] (Ph: [phone])  Comments: Patient states she lost her prescription and is indeed of another, realized it was missing today.   **Let patient know to contact pharmacy at the end of the day to make sure medication is ready. **  ** Please notify patient to allow 48-72 hours to process**  **Encourage patient to contact the pharmacy for refills or they can request refills through [facility]**

## 2022-10-29 ENCOUNTER — Other Ambulatory Visit (HOSPITAL_COMMUNITY): Payer: Self-pay | Admitting: Psychiatry

## 2022-10-29 ENCOUNTER — Other Ambulatory Visit: Payer: Self-pay | Admitting: Neurology

## 2022-10-29 DIAGNOSIS — F319 Bipolar disorder, unspecified: Secondary | ICD-10-CM

## 2022-11-03 ENCOUNTER — Telehealth: Payer: Self-pay | Admitting: Neurology

## 2022-11-03 DIAGNOSIS — G40009 Localization-related (focal) (partial) idiopathic epilepsy and epileptic syndromes with seizures of localized onset, not intractable, without status epilepticus: Secondary | ICD-10-CM

## 2022-11-03 DIAGNOSIS — G40309 Generalized idiopathic epilepsy and epileptic syndromes, not intractable, without status epilepticus: Secondary | ICD-10-CM

## 2022-11-03 NOTE — Telephone Encounter (Signed)
New message    [facility] has some questions regarding referral , asking for a call back to discuss.

## 2022-11-03 NOTE — Telephone Encounter (Signed)
Emu order faxed sent to [facility] fax number [phone] to [facility]

## 2022-11-04 ENCOUNTER — Other Ambulatory Visit (HOSPITAL_COMMUNITY): Payer: Self-pay

## 2022-11-10 ENCOUNTER — Other Ambulatory Visit (HOSPITAL_COMMUNITY): Payer: Self-pay

## 2022-11-11 ENCOUNTER — Other Ambulatory Visit (HOSPITAL_COMMUNITY): Payer: Self-pay

## 2022-11-11 ENCOUNTER — Telehealth: Payer: Self-pay | Admitting: Pharmacy Technician

## 2022-11-11 MED ORDER — EMGALITY 120 MG/ML ~~LOC~~ SOSY: 1.0000 | PREFILLED_SYRINGE | SUBCUTANEOUS | 11 refills | Status: DC

## 2022-11-11 NOTE — Telephone Encounter (Signed)
Patient Advocate Encounter  Received notification from [employer] that prior authorization for [facility] 120MG  is required.   PA submitted on 5.8.24 Key [id number] Status is pending

## 2022-11-11 NOTE — Telephone Encounter (Signed)
Patient Advocate Encounter  Prior Authorization for [facility] 120MG  has been approved with [employer] [facility]).    PA# [id number] Effective dates: 5.8.24 through 5.8.25  Per [facility] test claim, copay for 30 days supply is $4

## 2022-11-12 ENCOUNTER — Other Ambulatory Visit (HOSPITAL_COMMUNITY): Payer: Self-pay | Admitting: Psychiatry

## 2022-11-12 DIAGNOSIS — F319 Bipolar disorder, unspecified: Secondary | ICD-10-CM

## 2022-11-12 DIAGNOSIS — R251 Tremor, unspecified: Secondary | ICD-10-CM

## 2022-11-15 ENCOUNTER — Encounter: Payer: Self-pay | Admitting: Neurology

## 2022-11-18 ENCOUNTER — Encounter (HOSPITAL_COMMUNITY): Payer: Self-pay | Admitting: Psychiatry

## 2022-11-18 ENCOUNTER — Other Ambulatory Visit (HOSPITAL_COMMUNITY): Payer: Self-pay | Admitting: Psychiatry

## 2022-11-18 ENCOUNTER — Telehealth (HOSPITAL_BASED_OUTPATIENT_CLINIC_OR_DEPARTMENT_OTHER): Payer: Medicaid Other | Admitting: Psychiatry

## 2022-11-18 VITALS — Wt 326.0 lb

## 2022-11-18 DIAGNOSIS — F319 Bipolar disorder, unspecified: Secondary | ICD-10-CM

## 2022-11-18 DIAGNOSIS — F4310 Post-traumatic stress disorder, unspecified: Secondary | ICD-10-CM

## 2022-11-18 DIAGNOSIS — F900 Attention-deficit hyperactivity disorder, predominantly inattentive type: Secondary | ICD-10-CM

## 2022-11-18 MED ORDER — BREXPIPRAZOLE 1 MG PO TABS
1.0000 mg | ORAL_TABLET | Freq: Every day | ORAL | 1 refills | Status: DC
Start: 2022-11-18 — End: 2022-12-09
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
## 2022-11-23 ENCOUNTER — Other Ambulatory Visit (HOSPITAL_COMMUNITY): Payer: Self-pay

## 2022-11-28 ENCOUNTER — Encounter: Payer: Self-pay | Admitting: Neurology

## 2022-12-01 ENCOUNTER — Other Ambulatory Visit (HOSPITAL_COMMUNITY): Payer: Self-pay | Admitting: Psychiatry

## 2022-12-01 DIAGNOSIS — F319 Bipolar disorder, unspecified: Secondary | ICD-10-CM

## 2022-12-01 DIAGNOSIS — F900 Attention-deficit hyperactivity disorder, predominantly inattentive type: Secondary | ICD-10-CM

## 2022-12-01 DIAGNOSIS — F4310 Post-traumatic stress disorder, unspecified: Secondary | ICD-10-CM

## 2022-12-03 ENCOUNTER — Encounter: Payer: Self-pay | Admitting: Pharmacy Technician

## 2022-12-03 ENCOUNTER — Ambulatory Visit: Payer: Medicaid Other | Admitting: Neurology

## 2022-12-03 NOTE — Telephone Encounter (Signed)
Error

## 2022-12-08 ENCOUNTER — Telehealth (HOSPITAL_COMMUNITY): Payer: Self-pay | Admitting: *Deleted

## 2022-12-08 NOTE — Telephone Encounter (Signed)
Patient had gained weight and going back to Abilify may not helpful.  However patient is still want to try then she can take the 2.5 Abilify.  Please call the pharmacy.  We may need to do genetic testing if she started gaining weight with Abilify.

## 2022-12-08 NOTE — Telephone Encounter (Signed)
Pt called requesting to go back on Abilify 5 mg QD. Writer advised pt that she was to start Rexulti 1 mg after 11/18/22. Pt stated that her insurance, [employer], will not cover Rexulti so she is requesting something "similar that my insurance will cover'. Next appointment 12/30/22. Please review and advise.

## 2022-12-09 ENCOUNTER — Other Ambulatory Visit (HOSPITAL_COMMUNITY): Payer: Self-pay | Admitting: *Deleted

## 2022-12-09 MED ORDER — ARIPIPRAZOLE 5 MG PO TABS: ORAL_TABLET | ORAL | 0 refills | Status: DC

## 2022-12-30 ENCOUNTER — Telehealth (HOSPITAL_COMMUNITY): Payer: Medicaid Other | Admitting: Psychiatry

## 2023-01-04 ENCOUNTER — Telehealth: Payer: Self-pay | Admitting: Neurology

## 2023-01-04 NOTE — Telephone Encounter (Signed)
Pt is states that she was taken off of all of her seizure medication but the Kepra and  the spray

## 2023-01-05 ENCOUNTER — Telehealth (HOSPITAL_COMMUNITY): Payer: Medicaid Other | Admitting: Psychiatry

## 2023-01-05 ENCOUNTER — Ambulatory Visit: Payer: Medicaid Other | Admitting: Neurology

## 2023-01-06 ENCOUNTER — Other Ambulatory Visit (HOSPITAL_COMMUNITY): Payer: Self-pay | Admitting: Psychiatry

## 2023-01-08 ENCOUNTER — Other Ambulatory Visit (HOSPITAL_COMMUNITY): Payer: Self-pay | Admitting: Psychiatry

## 2023-01-14 ENCOUNTER — Other Ambulatory Visit (HOSPITAL_COMMUNITY): Payer: Self-pay | Admitting: Psychiatry

## 2023-01-14 DIAGNOSIS — F4310 Post-traumatic stress disorder, unspecified: Secondary | ICD-10-CM

## 2023-01-14 NOTE — Telephone Encounter (Signed)
Ok to schedule in work in slot 7/12 to discuss. Thanks

## 2023-01-15 ENCOUNTER — Other Ambulatory Visit: Payer: Self-pay

## 2023-01-15 MED ORDER — EMGALITY 120 MG/ML ~~LOC~~ SOSY: 1.0000 | PREFILLED_SYRINGE | SUBCUTANEOUS | 11 refills | Status: AC

## 2023-01-23 ENCOUNTER — Other Ambulatory Visit (HOSPITAL_COMMUNITY): Payer: Self-pay

## 2023-01-29 ENCOUNTER — Telehealth (HOSPITAL_BASED_OUTPATIENT_CLINIC_OR_DEPARTMENT_OTHER): Payer: Medicaid Other | Admitting: Psychiatry

## 2023-01-29 ENCOUNTER — Encounter (HOSPITAL_COMMUNITY): Payer: Self-pay | Admitting: Psychiatry

## 2023-01-29 VITALS — Wt 326.0 lb

## 2023-01-29 DIAGNOSIS — F4310 Post-traumatic stress disorder, unspecified: Secondary | ICD-10-CM

## 2023-01-29 DIAGNOSIS — F900 Attention-deficit hyperactivity disorder, predominantly inattentive type: Secondary | ICD-10-CM

## 2023-01-29 DIAGNOSIS — F319 Bipolar disorder, unspecified: Secondary | ICD-10-CM | POA: Diagnosis not present

## 2023-01-29 MED ORDER — ARIPIPRAZOLE 5 MG PO TABS
5.0000 mg | ORAL_TABLET | Freq: Every day | ORAL | 1 refills | Status: AC
Start: 2023-01-29 — End: ?
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
## 2023-02-10 ENCOUNTER — Telehealth (HOSPITAL_COMMUNITY): Payer: Self-pay | Admitting: *Deleted

## 2023-02-10 ENCOUNTER — Other Ambulatory Visit (HOSPITAL_COMMUNITY): Payer: Self-pay | Admitting: *Deleted

## 2023-02-10 MED ORDER — HYDROXYZINE PAMOATE 50 MG PO CAPS: 50.0000 mg | ORAL_CAPSULE | Freq: Three times a day (TID) | ORAL | 0 refills | Status: DC | PRN

## 2023-02-10 NOTE — Telephone Encounter (Signed)
Writer spoke with pt to ask pt if she agrees to the Hydroxyzine 50 mg TID PRN. Pt agrees. Script sent to pt pharmacy. Pt would still like to discuss ADHD medication prior to her next visit on 04/01/23 as she says she cannot concentrate, sleep is an issue, and that she has tried Adderall, Vyvanse, and Clonidine since she was a child and they helped. Pt says that it's her mother that doesn't want pt on stimulants. This nurse reminded pt that seizure d/o and ADHD medication has been discussed with Dr. Lolly Mustache previously. Please review and advise.

## 2023-02-10 NOTE — Telephone Encounter (Signed)
Pt of Dr. Lolly Mustache called stating that she feels she needs medication for anxiety as she says has increased, not lessening in any way, and also for ADHD. Pt has a h/o seizures therefore is not an medication specifically for ADHD I.e. stimulant. Pt is on Abilify 5 mg every day and Cogentin 1 mg at bedtime from Dr. Lolly Mustache. Her other meds are prescribed primarily by neurology;FYI. Pt was last seen on 01/29/23 and next visit is scheduled for 04/01/23. Dr. Lolly Mustache attached. Please review.

## 2023-02-11 NOTE — Telephone Encounter (Signed)
Pt says that she may have to find another provider to prescribe ADHD medication and reiterated last message regarding taking stimulants. Pt advised to do what she feels is best but advised to keep upcoming appointment on 04/01/23 as she may not be able to see another provider sooner than appointment date. Pt advised to cx appointment if she decides not to return to this practice. Pt verbalizes understanding.

## 2023-02-11 NOTE — Telephone Encounter (Signed)
No ADHD medication due to history of seizures

## 2023-02-11 NOTE — Telephone Encounter (Signed)
Thanks for the update.  She should be very careful about stimulant because she has a history of seizures and stimulants are contraindicated in seizures.  Let me know if she decided to go to other providers so we can close the case.

## 2023-02-11 NOTE — Telephone Encounter (Signed)
Will do. Thanks.

## 2023-02-22 ENCOUNTER — Telehealth: Payer: Medicaid Other

## 2023-02-22 ENCOUNTER — Telehealth (HOSPITAL_COMMUNITY): Payer: Self-pay

## 2023-02-22 NOTE — Telephone Encounter (Signed)
Patient is calling to see if you will reassess your position on stimulants for her. Patient states that she has never had a problem taking stimulants for her ADHD and that she has had the seizure disorder since she was a [place] child, patient states medication never exasperated her seizures. Please review and advise, thank you

## 2023-02-23 NOTE — Telephone Encounter (Signed)
No stimulants because history of seizures.

## 2023-02-24 ENCOUNTER — Other Ambulatory Visit: Payer: Self-pay | Admitting: Neurology

## 2023-03-04 ENCOUNTER — Other Ambulatory Visit (HOSPITAL_COMMUNITY): Payer: Self-pay | Admitting: Psychiatry

## 2023-03-05 ENCOUNTER — Other Ambulatory Visit (HOSPITAL_COMMUNITY): Payer: Self-pay | Admitting: Psychiatry

## 2023-03-12 ENCOUNTER — Ambulatory Visit: Payer: Medicaid Other | Admitting: Neurology

## 2023-03-22 ENCOUNTER — Other Ambulatory Visit (HOSPITAL_COMMUNITY): Payer: Self-pay

## 2023-03-22 ENCOUNTER — Other Ambulatory Visit: Payer: Self-pay | Admitting: Neurology

## 2023-03-22 ENCOUNTER — Encounter (HOSPITAL_COMMUNITY): Payer: Self-pay

## 2023-03-22 DIAGNOSIS — F4310 Post-traumatic stress disorder, unspecified: Secondary | ICD-10-CM

## 2023-03-22 MED ORDER — HYDROXYZINE PAMOATE 50 MG PO CAPS: 50.0000 mg | ORAL_CAPSULE | Freq: Three times a day (TID) | ORAL | 0 refills | Status: AC | PRN

## 2023-03-22 MED ORDER — LEVETIRACETAM 1000 MG PO TABS: ORAL_TABLET | ORAL | 0 refills | Status: DC

## 2023-03-22 MED ORDER — NORTRIPTYLINE HCL 50 MG PO CAPS
ORAL_CAPSULE | ORAL | 0 refills | Status: DC
Start: 2023-03-22 — End: 2023-06-22

## 2023-04-01 ENCOUNTER — Encounter (HOSPITAL_COMMUNITY): Payer: Self-pay

## 2023-04-01 ENCOUNTER — Telehealth (HOSPITAL_COMMUNITY): Payer: Medicaid Other | Admitting: Psychiatry

## 2023-04-01 NOTE — Progress Notes (Signed)
Patient no showed today.  I called the left a message if she like to schedule appointment in the future.  We will also send a letter.

## 2023-04-08 ENCOUNTER — Encounter: Payer: Self-pay | Admitting: Neurology

## 2023-04-13 ENCOUNTER — Other Ambulatory Visit (HOSPITAL_COMMUNITY): Payer: Self-pay | Admitting: Psychiatry

## 2023-04-16 ENCOUNTER — Ambulatory Visit: Payer: Medicaid Other | Admitting: Neurology

## 2023-04-21 MED ORDER — LEVETIRACETAM 1000 MG PO TABS: ORAL_TABLET | ORAL | 11 refills | Status: AC

## 2023-05-16 ENCOUNTER — Other Ambulatory Visit: Payer: Self-pay | Admitting: Neurology

## 2023-06-19 ENCOUNTER — Other Ambulatory Visit: Payer: Self-pay | Admitting: Neurology

## 2023-06-19 DIAGNOSIS — F4310 Post-traumatic stress disorder, unspecified: Secondary | ICD-10-CM

## 2023-07-15 ENCOUNTER — Other Ambulatory Visit: Payer: Self-pay | Admitting: Neurology

## 2023-07-15 DIAGNOSIS — F4310 Post-traumatic stress disorder, unspecified: Secondary | ICD-10-CM

## 2023-08-15 ENCOUNTER — Other Ambulatory Visit: Payer: Self-pay | Admitting: Neurology

## 2023-08-15 DIAGNOSIS — F4310 Post-traumatic stress disorder, unspecified: Secondary | ICD-10-CM

## 2023-10-15 ENCOUNTER — Telehealth: Payer: Self-pay | Admitting: Pharmacist

## 2023-10-15 NOTE — Telephone Encounter (Signed)
 Pharmacy Patient Advocate Encounter  Received notification from [facility] that Prior Authorization for Emgality 120MG /ML auto-injectors (migraine) has been APPROVED from 10/15/2023 to 10/14/2024   PA #/Case ID/Reference #: [id number]

## 2023-10-15 NOTE — Telephone Encounter (Signed)
 ERROR

## 2023-10-15 NOTE — Telephone Encounter (Signed)
 Pharmacy Patient Advocate Encounter   Received notification from CoverMyMeds that prior authorization for Emgality 120MG /ML auto-injectors (migraine) is required/requested.   Insurance verification completed.   The patient is insured through [facility] MEDICAID .   Per test claim: PA required; PA submitted to above mentioned insurance via CoverMyMeds Key/confirmation #/EOC [id number] Status is pending

## 2023-10-20 ENCOUNTER — Other Ambulatory Visit: Payer: Self-pay | Admitting: Neurology
